# Patient Record
Sex: Female | Born: 1975 | Race: White | Hispanic: No | Marital: Single | Smoking: Never smoker
Health system: Southern US, Community
[De-identification: ages and names within clinical notes are randomized; demographics above are authoritative.]

## PROBLEM LIST (undated history)

## (undated) DIAGNOSIS — I499 Cardiac arrhythmia, unspecified: Secondary | ICD-10-CM

## (undated) DIAGNOSIS — R87619 Unspecified abnormal cytological findings in specimens from cervix uteri: Secondary | ICD-10-CM

## (undated) DIAGNOSIS — IMO0002 Reserved for concepts with insufficient information to code with codable children: Secondary | ICD-10-CM

## (undated) DIAGNOSIS — F419 Anxiety disorder, unspecified: Secondary | ICD-10-CM

## (undated) DIAGNOSIS — H9190 Unspecified hearing loss, unspecified ear: Secondary | ICD-10-CM

## (undated) HISTORY — PX: CLEFT LIP REPAIR: SUR1164

## (undated) HISTORY — DX: Unspecified abnormal cytological findings in specimens from cervix uteri: R87.619

## (undated) HISTORY — DX: Reserved for concepts with insufficient information to code with codable children: IMO0002

## (undated) HISTORY — PX: LEEP: SHX91

---

## 2000-02-10 ENCOUNTER — Encounter: Admission: RE | Admit: 2000-02-10 | Discharge: 2000-02-10 | Payer: Self-pay | Admitting: Family Medicine

## 2001-10-17 ENCOUNTER — Other Ambulatory Visit: Admission: RE | Admit: 2001-10-17 | Discharge: 2001-10-17 | Payer: Self-pay | Admitting: Family Medicine

## 2001-11-09 ENCOUNTER — Encounter: Admission: RE | Admit: 2001-11-09 | Discharge: 2001-11-09 | Payer: Self-pay | Admitting: Family Medicine

## 2002-08-21 ENCOUNTER — Encounter: Admission: RE | Admit: 2002-08-21 | Discharge: 2002-08-21 | Payer: Self-pay | Admitting: Family Medicine

## 2003-11-16 ENCOUNTER — Encounter: Admission: RE | Admit: 2003-11-16 | Discharge: 2003-11-16 | Payer: Self-pay | Admitting: Family Medicine

## 2003-12-20 ENCOUNTER — Encounter (INDEPENDENT_AMBULATORY_CARE_PROVIDER_SITE_OTHER): Payer: Self-pay | Admitting: Specialist

## 2003-12-20 ENCOUNTER — Encounter: Admission: RE | Admit: 2003-12-20 | Discharge: 2003-12-20 | Payer: Self-pay | Admitting: Obstetrics and Gynecology

## 2003-12-20 ENCOUNTER — Other Ambulatory Visit: Admission: RE | Admit: 2003-12-20 | Discharge: 2003-12-20 | Payer: Self-pay | Admitting: Family Medicine

## 2004-01-03 ENCOUNTER — Encounter: Admission: RE | Admit: 2004-01-03 | Discharge: 2004-01-03 | Payer: Self-pay | Admitting: Family Medicine

## 2004-01-10 ENCOUNTER — Encounter: Admission: RE | Admit: 2004-01-10 | Discharge: 2004-01-10 | Payer: Self-pay | Admitting: Family Medicine

## 2004-04-17 ENCOUNTER — Encounter (INDEPENDENT_AMBULATORY_CARE_PROVIDER_SITE_OTHER): Payer: Self-pay | Admitting: Specialist

## 2004-04-17 ENCOUNTER — Encounter: Admission: RE | Admit: 2004-04-17 | Discharge: 2004-04-17 | Payer: Self-pay | Admitting: Obstetrics and Gynecology

## 2004-12-05 ENCOUNTER — Ambulatory Visit: Payer: Self-pay | Admitting: Family Medicine

## 2004-12-05 ENCOUNTER — Encounter (INDEPENDENT_AMBULATORY_CARE_PROVIDER_SITE_OTHER): Payer: Self-pay | Admitting: *Deleted

## 2005-06-12 ENCOUNTER — Ambulatory Visit: Payer: Self-pay | Admitting: Family Medicine

## 2005-12-04 ENCOUNTER — Ambulatory Visit: Payer: Self-pay | Admitting: *Deleted

## 2005-12-04 ENCOUNTER — Encounter (INDEPENDENT_AMBULATORY_CARE_PROVIDER_SITE_OTHER): Payer: Self-pay | Admitting: *Deleted

## 2007-03-31 ENCOUNTER — Ambulatory Visit: Payer: Self-pay | Admitting: Family Medicine

## 2007-03-31 ENCOUNTER — Encounter: Payer: Self-pay | Admitting: Obstetrics and Gynecology

## 2011-02-10 NOTE — Group Therapy Note (Signed)
Adrienne Wood, Adrienne Wood           ACCOUNT NO.:  [id number]   MEDICAL RECORD NO.:  [id number]          PATIENT TYPE:  WOC   LOCATION:  [facility]                   FACILITY:  [facility]   PHYSICIAN:  Elsie Lincoln, MD      DATE OF BIRTH:  [birth year]   DATE OF SERVICE:  03/31/2007                                  CLINIC NOTE   DICTATION:  Dr. Sylvan Cheese dictating for Dr. Elsie Lincoln.   CHIEF COMPLAINT:  Annual exam with Pap smear.   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  Her  Pap smears in March of 2006, September of 2006, and March of 2007 have  all been within normal limits.  She denies any problems.  She has  stopped taking the Ortho-Evra patch for birth control due to expense and  is currently using condoms for birth control.  She has been sexually  active with one partner in the past year.   PHYSICAL EXAMINATION:  VITAL SIGNS:  Stable and within normal limits   Dictation ended at this point.     ______________________________  Elsie Lincoln, MD    ______________________________  Elsie Lincoln, MD    KL/MEDQ  D:  03/31/2007  T:  03/31/2007  Job:  [id number]

## 2011-02-10 NOTE — Group Therapy Note (Signed)
NAMECOLIE, JOSTEN           ACCOUNT NO.:  [id number]   MEDICAL RECORD NO.:  [id number]          PATIENT TYPE:  WOC   LOCATION:  [facility]                   FACILITY:  [facility]   PHYSICIAN:  Elsie Lincoln, MD      DATE OF BIRTH:  04-Aug-1976   DATE OF SERVICE:  03/31/2007                                  CLINIC NOTE   DICTATION:  Dr. Sylvan Cheese dictating for Dr. Elsie Lincoln.   CHIEF COMPLAINT:  Annual exam with Pap smear.   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  Her  Pap smears in March of 2006, September of 2006, and March of 2007, were  all within normal limits.  She is currently not having any gynecological  problems.  She has stopped the Ortho-Evra patch due to high expense and  is currently using condoms for birth control.  She has been sexually  active with one partner in the past year.   PHYSICAL EXAMINATION:  VITAL SIGNS:  Stable and within normal limits.  GENERAL:  The patient is alert and cooperative with the examination and  appears in no distress.  GENITOURINARY:  Normal external female genitalia, the vagina is moist  with pink rugae.  The cervix has an enlarged squamocolumnar junction.  A  Pap smear was performed today.   IMPRESSION:  History of cervical intraepithelial neoplasia 3, status  post loop electrosurgical excision procedure and with three previous  normal Pap smears.   PLAN:  A Pap smear was performed today.  We will continue yearly Pap  smear if this one is normal.  The patient will return in one year for  Pap smear.     ______________________________  Elsie Lincoln, MD    ______________________________  Elsie Lincoln, MD    KL/MEDQ  D:  03/31/2007  T:  04/01/2007  Job:  [id number]

## 2011-02-13 NOTE — Group Therapy Note (Signed)
NAMEALYNAH, SCHONE           ACCOUNT NO.:  [id number]   MEDICAL RECORD NO.:  [id number]          PATIENT TYPE:  WOC   LOCATION[facility]  [facility]                   FACILITY[facility]  [facility]   PHYSICIAN:  Tinnie Gens, MD        DATE OF BIRTH:  17-Nov-1975   DATE OF SERVICE:                                    CLINIC NOTE   CHIEF COMPLAINT:  Repeat Pap.   HISTORY OF PRESENT ILLNESS:  The patient is a 35 year old gravida 1, para 0,  who is status post LEEP in March 2005 for CIN3.  She needs repeat Pap today,  her last Pap was in July 2005, she had slight squamous atypia with a  negative high risk HPV type.  Today she has done well, she continues on  Ortho Novum and is without complaints.   PHYSICAL EXAMINATION:  Her vitals are as noted on the chart.  She is a well-  developed, well-nourished white female in no acute distress.  GU:  She has  normal external female genitalia, the cervix is visualized, scarring from  old LEEP is noted, but no other lesion.  On exam of her uterus, it is  anteverted, adnexa were without mass or tenderness.   IMPRESSION:  Status post loop electrosurgical excision procedure November 29, 2003.   PLAN:  Pap smear today, followup Pap in six months.      TP/MEDQ  D:  12/05/2004  T:  12/06/2004  Job:  [id number]

## 2011-02-13 NOTE — Group Therapy Note (Signed)
NAME:  Adrienne Wood, Adrienne Wood                     ACCOUNT NO.:  [id number]   MEDICAL RECORD NO.:  [id number]                   PATIENT TYPE:  OUT   LOCATION:  [facility]                           [facility]ACILITY[facility]   PHYSICIAN:  Tinnie Gens, MD                     DATE OF BIRTH:  1976/03/05   DATE OF SERVICE:  12/20/2003                                    CLINIC NOTE   CHIEF COMPLAINT:  Abnormal Pap.   HISTORY OF PRESENT ILLNESS:  The patient is a 35 year old gravida 1 para 0  who has CIN-3 on biopsy with extension to the endocervical gland who comes  for a LEEP treatment.   PROCEDURE:  The patient was placed in the dorsal lithotomy.  Acetic acid was  applied to the cervix.  The largest acetowhite area was noted from 12 to 3  o'clock.  The cervix was subsequently numbed with 10 mL of lidocaine and  then a LEEP cone taken of the anterior and posterior portion of the cervix  as well as the endocervical gland section followed by cauterization with the  ball and an application of Monsel solution.  Hemostasis was excellent. The  patient tolerated the procedure well.  She was taken out of lithotomy.   IMPRESSION:  Loop electrosurgical excision procedure for cervical  intraepithelial neoplasia grade 3.   PLAN:  Will follow her up in 2 weeks, look at her cervix, and review  pathology.                                               Tinnie Gens, MD    TP/MEDQ  D:  12/20/2003  T:  12/20/2003  Job:  [id number]

## 2011-02-13 NOTE — Group Therapy Note (Signed)
NAME:  Adrienne Wood, Adrienne Wood                     ACCOUNT NO.:  [id number]   MEDICAL RECORD NO.:  [id number]                   PATIENT TYPE:  OUT   LOCATION[facility]                           [facility]   PHYSICIAN:  Tinnie Gens, MD                     DATE OF BIRTH:  1975-12-19   DATE OF SERVICE:  04/17/2004                                    CLINIC NOTE   CHIEF COMPLAINT:  Abnormal Pap.   HISTORY OF PRESENT ILLNESS:  The patient is a 35 year old gravida 1 para 0  who is status post LEEP in March 2005.  She needs a repeat Pap today.  She  reports that she is on the Ortho Evra patch and would like to continue this  but needs refills.   PHYSICAL EXAMINATION TODAY:  VITAL SIGNS:  As noted in the chart.  GENERAL:  A well-developed, well-nourished white female in no acute  distress.  GENITOURINARY:  Normal external female genitalia.  The cervix is visualized.  Scarring from the old LEEP is noted but there is no other lesion.  Pap smear  is obtained.  On exam her uterus is anteverted.  No adnexal mass or  tenderness.   IMPRESSION:  1. Cervical intraepithelial neoplasia grade 3 status post loop     electrocautery excision procedure.  2. Contraceptive counseling.   PLAN:  1. Pap smear today.  2. Ortho Evra patch refilled.                                               Tinnie Gens, MD    TP/MEDQ  D:  04/17/2004  T:  04/17/2004  Job:  [id number]

## 2011-02-13 NOTE — Group Therapy Note (Signed)
NAME:  Adrienne Wood, Adrienne Wood NO.:  [id number]   MEDICAL RECORD NO.:  [id number]                   PATIENT TYPE:  OUT   LOCATION[facility]                           [facility]ILITY[facility]   PHYSICIAN:  Tinnie Gens, MD                     DATE OF BIRTH:  March 06, 1976   DATE OF SERVICE:  11/16/2003                                    CLINIC NOTE   CHIEF COMPLAINT:  The patient is a 35 year old G1 P0 who has been referred  here from [facility] with high-grade SIL on Pap followed by CIN 3 with  involvement of endocervical glands but a negative ECC on colposcopy.  The  patient has lost her insurance and desires to have follow-up treatment for  this.   PAST MEDICAL HISTORY:  Negative.   PAST SURGICAL HISTORY:  She had an ear operation and repair of cleft lip in  1979.   OBSTETRICAL HISTORY:  She is a G1 P0.  She had an SAB approximately 6 months  ago with Cytotec used for its conclusion.   GYNECOLOGICAL HISTORY:  Menarche is age 29.  LMP November 13, 2003.  Cycles  come every 28 days, last 7 days.  She is currently using condoms for birth  control.  She does have a history of abnormal Pap in 2001, 2002, 2004 with  colposcopy that was benign results at those times.   FAMILY HISTORY:  Significant for coronary artery disease in a dad and  grandmother and a stroke in her grandmother.   SOCIAL HISTORY:  She denies tobacco, alcohol, or drug use.   REVIEW OF SYMPTOMS:  A 14-point review of systems is reviewed and negative.   PHYSICAL EXAMINATION:  VITAL SIGNS:  Her pulse is 96, blood pressure 132/81,  weight 105.  GENERAL:  She is thin white female in no acute distress.  LUNGS:  Clear bilaterally.  CARDIOVASCULAR:  Regular rate and rhythm.  No rubs, gallops, murmurs.   I have advised the patient of all the options and treatment recommendations.  She will proceed with LEEP.  She will watch the video today.  I will also  give her Valium 5 mg to take 30 minutes prior to  the procedure to help with  her nerves.                                               Tinnie Gens, MD    TP/MEDQ  D:  11/16/2003  T:  11/16/2003  Job:  [id number]

## 2011-02-13 NOTE — Group Therapy Note (Signed)
Adrienne Wood, Adrienne Wood           ACCOUNT NO.:  [id number]   MEDICAL RECORD NO.:  [id number]          PATIENT TYPE:  WOC   [facility]ATION[facility]                   [facility]   PHYSICIAN:  Tinnie Gens, MD        DATE OF BIRTH:  1975-12-27   DATE OF SERVICE:  06/12/2005                                    CLINIC NOTE   CHIEF COMPLAINT:  Pap smear.   HISTORY OF PRESENT ILLNESS:  Patient is a 35 year old gravida 1, para 0 who  is status post LEEP in March of 2005 for CIN III.  Her last Pap was in March  of 2006 and was normal and she is for her six month follow-up.  Patient is  without complaints today.   PHYSICAL EXAMINATION:  VITAL SIGNS:  As noted in the chart.  GENERAL:  She is a well-developed, well-nourished white female in no acute  distress.  GENITOURINARY:  Normal external female genitalia.  Vagina is pink and  rugated.  The cervix is visualized and has an enlarged squamocolumnar  junction probably related to her previous LEEP.  The uterus is small,  anteverted.  Adnexa are without mass or tenderness.   IMPRESSION:  History of CIN III status post LEEP.  Last Pap smear was normal  in March for six-month follow-up today.   PLAN:  Pap smear today.  If this is normal will follow up again in six  months and then go to yearly Paps.           ______________________________  Tinnie Gens, MD     TP/MEDQ  D:  06/12/2005  T:  06/12/2005  Job:  [id number]

## 2011-02-13 NOTE — Group Therapy Note (Signed)
Adrienne Wood, Adrienne Wood           ACCOUNT NO.:  [id number]   MEDICAL RECORD NO.:  [id number]          PATIENT TYPE:  WOC   [facility]ATION[facility]                   [facility]   PHYSICIAN:  Carolanne Grumbling, M.D.   DATE OF BIRTH:  02/27/1976   DATE OF SERVICE:  12/04/2005                                    CLINIC NOTE   A 35 year old here for repeat Pap smear.  The patient is status post a LEEP  in March of 2005 for CIN-3.  Pap smear in March 2006 and September of 2006  were both within normal limits.  The patient denies any problems.  She is  currently on Ortho Evra for birth control.   PHYSICAL EXAMINATION:  VITAL SIGNS:  Per the chart.  GENERAL:  Well-developed, well-nourished female in no apparent distress.  GENITOURINARY:  Normal external female genitalia.  The vagina has pink  rugae.  The cervix does have an enlarged squamocolumnar junction.  A Pap  smear was performed.   IMPRESSION:  History of cervical intraepithelial neoplasia 3 status post  loop electrosurgical excision procedure and 2 previous Pap smears were  within normal limits.   PLAN:  A Pap smear today.  If normal, we will start doing yearly Pap smears.           ______________________________  Carolanne Grumbling, M.D.     TW/MEDQ  D:  12/04/2005  T:  12/05/2005  Job:  [id number]

## 2011-12-02 ENCOUNTER — Encounter: Payer: Self-pay | Admitting: Obstetrics and Gynecology

## 2011-12-08 ENCOUNTER — Encounter: Payer: Self-pay | Admitting: Family Medicine

## 2011-12-14 ENCOUNTER — Encounter: Payer: Self-pay | Admitting: Obstetrics & Gynecology

## 2011-12-21 ENCOUNTER — Other Ambulatory Visit (HOSPITAL_COMMUNITY)
Admission: RE | Admit: 2011-12-21 | Discharge: 2011-12-21 | Disposition: A | Discharge status: Home / Self Care | Payer: Self-pay | Source: Ambulatory Visit | Attending: Family | Admitting: Family

## 2011-12-21 ENCOUNTER — Encounter: Payer: Self-pay | Admitting: Physician Assistant

## 2011-12-21 ENCOUNTER — Ambulatory Visit (INDEPENDENT_AMBULATORY_CARE_PROVIDER_SITE_OTHER): Payer: Self-pay | Admitting: Physician Assistant

## 2011-12-21 VITALS — BP 134/86 | HR 89 | Temp 98.3°F | Ht 61.0 in | Wt 118.0 lb

## 2011-12-21 DIAGNOSIS — R87619 Unspecified abnormal cytological findings in specimens from cervix uteri: Secondary | ICD-10-CM | POA: Insufficient documentation

## 2011-12-21 DIAGNOSIS — Z01812 Encounter for preprocedural laboratory examination: Secondary | ICD-10-CM

## 2011-12-21 DIAGNOSIS — IMO0002 Reserved for concepts with insufficient information to code with codable children: Secondary | ICD-10-CM

## 2011-12-21 DIAGNOSIS — R87612 Low grade squamous intraepithelial lesion on cytologic smear of cervix (LGSIL): Secondary | ICD-10-CM

## 2011-12-21 LAB — POCT PREGNANCY, URINE: Preg Test, Ur: NEGATIVE

## 2011-12-21 NOTE — Patient Instructions (Signed)
Colposcopy Care After Colposcopy is a procedure in which a special tool is used to magnify the surface of the cervix. A tissue sample (biopsy) may also be taken. This sample will be looked at for cervical cancer or other problems. After the test:  You may have some cramping.   Lie down for a few minutes if you feel lightheaded.    You may have some bleeding which should stop in a few days.  HOME CARE  Do not have sex or use tampons for 2 to 3 days or as told.   Only take medicine as told by your doctor.   Continue to take your birth control pills as usual.  Finding out the results of your test Ask when your test results will be ready. Make sure you get your test results. GET HELP RIGHT AWAY IF:  You are bleeding a lot or are passing blood clots.   You develop a fever of 102 F (38.9 C) or higher.   You have abnormal vaginal discharge.   You have cramps that do not go away with medicine.   You feel lightheaded, dizzy, or pass out (faint).  MAKE SURE YOU:   Understand these instructions.   Will watch your condition.   Will get help right away if you are not doing well or get worse.  Document Released: 03/02/2008 Document Revised: 09/03/2011 Document Reviewed: 03/02/2008 [employer] Patient Information 2012 [employer]. 

## 2011-12-21 NOTE — Progress Notes (Signed)
Chief Complaint:  Colposcopy   Adrienne Wood is  36 y.o. G1P0010.  Patient's last menstrual period was 11/07/2011.Marland Kitchen  Her pregnancy status is negative.  She presents complaining of Colposcopy  Presents for colposcopy secondary to LSIL pap in 10/2011 in [place]. Hx of LEEP for CIN2/3 in 2005.   Obstetrical/Gynecological History: OB History    Grav Para Term Preterm Abortions TAB SAB Ect Mult Living   1 0 0 0 1  1   0      Past Medical History: Past Medical History  Diagnosis Date  . Abnormal Pap smear     Past Surgical History: Past Surgical History  Procedure Date  . Cleft lip repair   . Leep     Family History: History reviewed. No pertinent family history.  Social History: History  Substance Use Topics  . Smoking status: Never Smoker   . Smokeless tobacco: Never Used  . Alcohol Use: Yes    Allergies: No Known Allergies  Review of Systems - Negative except what has been reviewed in HPI  Physical Exam   Blood pressure 134/86, pulse 89, temperature 98.3 F (36.8 C), temperature source Oral, height 5\' 1"  (1.549 m), weight 118 lb (53.524 kg), last menstrual period 11/07/2011.  General: General appearance - alert, well appearing, and in no distress, oriented to person, place, and time and normal appearing weight Mental status - alert, oriented to person, place, and time, normal mood, behavior, speech, dress, motor activity, and thought processes, affect appropriate to mood Focused Gynecological Exam: Patient given informed consent, signed copy in the chart, time out was performed.  Placed in lithotomy position. Cervix viewed with speculum and colposcope after application of acetic acid.   Colposcopy adequate?  yes Acetowhite lesions?yes Punctation?yes Mosaicism?  no Abnormal vasculature?  no Biopsies?6 o'clock, 8 o'clock, 11 o'clock ECC?yes  COMMENTS:6 and 8 o'clock favor HPV and CIN 1, 11 o'clock possibly higher grade (punctation)   Labs: Recent  Results (from the past 24 hour(s))  POCT PREGNANCY, URINE   Collection Time   12/21/11  1:27 PM      Component Value Range   Preg Test, Ur NEGATIVE  NEGATIVE    Imaging Studies:  No results found.   Assessment: 1. LSIL (low grade squamous intraepithelial lesion) on Pap smear  Surgical pathology   Plan: RTC in 2-3 weeks for results Recommend daily folic acid supplementation  Adrienne Wood E. 12/21/2011,1:47 PM

## 2011-12-21 NOTE — Progress Notes (Signed)
Addended by: Sherre Lain A on: 12/21/2011 04:33 PM   Modules accepted: Orders

## 2012-01-06 ENCOUNTER — Telehealth: Payer: Self-pay | Admitting: *Deleted

## 2012-01-06 NOTE — Telephone Encounter (Signed)
Pt called requesting her colpo results. I called her back and let her know that I would check with Adrienne Wood on the results and what follow up she needs and we would call her back with either the results or to let her know to keep her upcoming appt.

## 2012-01-11 ENCOUNTER — Ambulatory Visit: Payer: Self-pay | Admitting: Advanced Practice Midwife

## 2012-01-13 NOTE — Telephone Encounter (Signed)
CIN I. Low grade abnormal.Needs repap and HPV testing in 12 months

## 2012-01-13 NOTE — Telephone Encounter (Signed)
Spoke with patient and advised her of results. Pt voiced understanding and will cancel her appt for next week. She inquired about getting her depo shot here in the future. I told her that she didn't have to come here for the shot, she could continue going to the provider that was giving it to her originally. She stated that if she needed to come here she would call for appt.

## 2012-01-28 ENCOUNTER — Ambulatory Visit: Payer: Self-pay | Admitting: Advanced Practice Midwife

## 2012-08-22 ENCOUNTER — Ambulatory Visit: Payer: Self-pay | Admitting: Obstetrics & Gynecology

## 2012-09-19 ENCOUNTER — Ambulatory Visit: Payer: Self-pay | Admitting: Obstetrics & Gynecology

## 2013-04-20 ENCOUNTER — Ambulatory Visit (INDEPENDENT_AMBULATORY_CARE_PROVIDER_SITE_OTHER): Payer: BC Managed Care – PPO | Admitting: Family Medicine

## 2013-04-20 ENCOUNTER — Encounter: Payer: Self-pay | Admitting: Family Medicine

## 2013-04-20 VITALS — BP 126/84 | HR 87 | Temp 98.0°F | Ht 61.0 in | Wt 130.2 lb

## 2013-04-20 DIAGNOSIS — N870 Mild cervical dysplasia: Secondary | ICD-10-CM

## 2013-04-20 NOTE — Patient Instructions (Addendum)
HPV Test The HPV (human papillomavirus) test is used to screen for high-risk types with HPV infection. HPV is a group of about 100 related viruses, of which 40 types are genital viruses. Most HPV viruses cause infections that usually resolve without treatment within 2 years. Some HPV infections can cause skin and genital warts (condylomata). HPV types 16, 18, 31 and 45 are considered high-risk types of HPV. High-risk types of HPV do not usually cause visible warts, but if untreated, may lead to cancers of the outlet of the womb (cervix) or anus. An HPV test identifies the DNA (genetic) strands of the HPV infection. Because the test identifies the DNA strands, the test is also referred to as the HPV DNA test. Although HPV is found in both males and females, the HPV test is only used to screen for cervical cancer in females. This test is recommended for females:  With an abnormal Pap test.  After treatment of an abnormal Pap test.  Aged 20 and older.  After treatment of a high-risk HPV infection. The HPV test may be done at the same time as a Pap test in females over the age of 20. Both the HPV and Pap test require a sample of cells from the cervix. PREPARATION FOR TEST  You may be asked to avoid douching, tampons, or vaginal medicines for 48 hours before the HPV test. You will be asked to urinate before the test. For the HPV test, you will need to lie on an exam table with your feet in stirrups. A spatula will be inserted into the vagina. The spatula will be used to swab the cervix for a cell and mucus sample. The sample will be further evaluated in a lab under a microscope. NORMAL FINDINGS  Normal: High-risk HPV is not found.  Ranges for normal findings may vary among different laboratories and hospitals. You should always check with your doctor after having lab work or other tests done to discuss the meaning of your test results and whether your values are considered within normal  limits. MEANING OF TEST An abnormal HPV test means that high-risk HPV is found. Your caregiver may recommend further testing. Your caregiver will go over the test results with you. He or she will and discuss the importance and meaning of your results, as well as treatment options and the need for additional tests, if necessary. OBTAINING THE RESULTS  It is your responsibility to obtain your test results. Ask the lab or department performing the test when and how you will get your results. Document Released: 10/09/2004 Document Revised: 12/07/2011 Document Reviewed: 06/24/2005 [facility] Patient Information 2014 [place], [place].  Pap Test A Pap test is a procedure done in a clinic office to evaluate cells that are on the surface of the cervix. The cervix is the lower portion of the uterus and upper portion of the vagina. For some women, the cervical region has the potential to form cancer. With consistent evaluations by your caregiver, this type of cancer can be prevented.  If a Pap test is abnormal, it is most often a result of a previous exposure to human papillomavirus (HPV). HPV is a virus that can infect the cells of the cervix and cause dysplasia. Dysplasia is where the cells no longer look normal. If a woman has been diagnosed with high-grade or severe dysplasia, they are at higher risk of developing cervical cancer. People diagnosed with low-grade dysplasia should still be seen by their caregiver because there is a small  chance that low-grade dysplasia could develop into cancer.  LET YOUR CAREGIVER KNOW ABOUT:  Recent sexually transmitted infection (STI) you have had.  Any new sex partners you have had.  History of previous abnormal Pap tests results.  History of previous cervical procedures you have had (colposcopy, biopsy, loop electrosurgical excision procedure [LEEP]).  Concerns you have had regarding unusual vaginal discharge.  History of pelvic pain.  Your use of birth  control. BEFORE THE PROCEDURE  Ask your caregiver when to schedule your Pap test. It is best not to be on your period if your caregiver uses a wooden spatula to collect cells or applies cells to a glass slide. Newer techniques are not so sensitive to the timing of a menstrual cycle.  Do not douche or have sexual intercourse for 24 hours before the test.   Do not use vaginal creams or tampons for 24 hours before the test.   Empty your bladder just before the test to lessen any discomfort.  PROCEDURE You will lie on an exam table with your feet in stirrups. A warm metal or plastic instrument (speculum) is placed in your vagina. This instrument allows your caregiver to see the inside of your vagina and look at your cervix. A small, plastic brush or wooden spatula is then used to collect cervical cells. These cells are placed in a lab specimen container. The cells are looked at under a microscope. A specialist will determine if the cells are normal.  AFTER THE PROCEDURE Make sure to get your test results.If your results come back abnormal, you may need further testing.  Document Released: 12/05/2002 Document Revised: 12/07/2011 Document Reviewed: 09/10/2011 [facility] Patient Information 2014 [place], [place].

## 2013-04-20 NOTE — Progress Notes (Signed)
Patient ID: Adrienne Wood, female   DOB: 1976-01-30, 37 y.o.   MRN: [id number]  S: 37 yo G1P0010 coming in for a pap smear.  Had colpo 11/2011 which showed CIN1 and told to do repap in one year.  History of LEEP in 2005 from CIN3 by colpo.  Does not recall having colpo back in 11/2011 but documented in EMR and surgical pathology report.   Had two subsequent pap smears that were normal, following pap smear [facility] on [facility] .  Has had one sexual partner since last visit but not interested in STI testing today.  Denies vaginal discharge, bleeding, pelvic pain.  Past Medical History  Diagnosis Date  . Abnormal Pap smear    No Known Allergies History reviewed. No pertinent family history. History   Social History  . Marital Status: Single    Spouse Name: N/A    Number of Children: N/A  . Years of Education: N/A   Occupational History  . Not on file.   Social History Main Topics  . Smoking status: Never Smoker   . Smokeless tobacco: Never Used  . Alcohol Use: Yes     Comment: occasional  . Drug Use: No  . Sexually Active: No   Other Topics Concern  . Not on file   Social History Narrative  . No narrative on file    O: BP 126/84  Pulse 87  Temp(Src) 98 F (36.7 C)  Ht 5\' 1"  (1.549 m)  Wt 59.058 kg (130 lb 3.2 oz)  BMI 24.61 kg/m2  LMP 04/01/2013 General:  Alert, cooperative, under no distress Cardio:  Regular rate and rhythm, no murmurs or gallops Chest:  No respiratory distress, lungs clear bilaterally to auscultation Breast:  No masses or tenderness to palpation, no axillary lymphadenopathy Pelvic:  Mild discharge, no gross lesions seen on speculum exam, no adnexal or cervical motion tenderness    A: 1 year (18 months) follow from Colpo from CIN-1 (11/2011) preceded by LSIL pap.   Hx of CIN-3 with LEEP in 2005  P:  Proceed as per ASCCP guidelines once results are obtained.     I saw and examined patient and agree with above student  note. Pap with HPV co-test ordered per guidelines. Discussed importance of HPV and relation to cervical cancer.  Napoleon Form, MD

## 2013-04-27 ENCOUNTER — Encounter: Payer: Self-pay | Admitting: Family Medicine

## 2013-04-27 DIAGNOSIS — IMO0002 Reserved for concepts with insufficient information to code with codable children: Secondary | ICD-10-CM | POA: Insufficient documentation

## 2013-05-01 ENCOUNTER — Telehealth: Payer: Self-pay | Admitting: Obstetrics and Gynecology

## 2013-05-01 NOTE — Telephone Encounter (Addendum)
Message copied by Toula Moos on Mon May 01, 2013  1:52 PM   ------called patient and gave result of pap and need pap in one year as stated below. Patient agrees and satisfied.       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 She will need repeat cotesting - pap with HPV in one year. Please let her know that she DOES need follow up in one year. ------

## 2014-04-25 ENCOUNTER — Ambulatory Visit (INDEPENDENT_AMBULATORY_CARE_PROVIDER_SITE_OTHER): Payer: BC Managed Care – PPO | Admitting: Advanced Practice Midwife

## 2014-04-25 ENCOUNTER — Encounter: Payer: Self-pay | Admitting: Advanced Practice Midwife

## 2014-04-25 VITALS — BP 127/75 | HR 74 | Temp 98.3°F | Resp 20 | Ht 61.0 in | Wt 130.8 lb

## 2014-04-25 DIAGNOSIS — N760 Acute vaginitis: Secondary | ICD-10-CM

## 2014-04-25 DIAGNOSIS — A499 Bacterial infection, unspecified: Secondary | ICD-10-CM

## 2014-04-25 DIAGNOSIS — N898 Other specified noninflammatory disorders of vagina: Secondary | ICD-10-CM

## 2014-04-25 DIAGNOSIS — N9489 Other specified conditions associated with female genital organs and menstrual cycle: Secondary | ICD-10-CM

## 2014-04-25 DIAGNOSIS — Z124 Encounter for screening for malignant neoplasm of cervix: Secondary | ICD-10-CM

## 2014-04-25 DIAGNOSIS — B9689 Other specified bacterial agents as the cause of diseases classified elsewhere: Secondary | ICD-10-CM

## 2014-04-25 MED ORDER — METRONIDAZOLE 500 MG PO TABS: 500.0000 mg | ORAL_TABLET | Freq: Two times a day (BID) | ORAL | Status: DC

## 2014-04-25 NOTE — Progress Notes (Signed)
Pt here for routine Gyn exam w/Pap & HPV co-testing

## 2014-04-25 NOTE — Progress Notes (Signed)
  Subjective:     Adrienne Wood is a 38 y.o. woman who comes in today for a well-woman exam and pap.  She has hx of abnormal pap and LEEP in 2005 with normal pap results following procedure until 2013.  F/U Pap in 2014 with normal cytology but positive HPV.  She reports an odor with discharge occasionally, worse near her menses.  She originally wanted to start Depo Provera at today's visit but is no longer sexually active at this time so does not want hormonal contraception now.    The following portions of the patient's history were reviewed and updated as appropriate: allergies, current medications, past family history, past medical history, past social history, past surgical history and problem list.  Review of Systems A comprehensive review of systems was negative except for: vaginal discharge with odor occurring before/after menses each month.   Objective:    BP 127/75  Pulse 74  Temp(Src) 98.3 F (36.8 C) (Oral)  Resp 20  Ht 5\' 1"  (1.549 m)  Wt 130 lb 12.8 oz (59.33 kg)  BMI 24.73 kg/m2  LMP 04/08/2014 Physical Examination: General appearance - alert, well appearing, and in no distress, oriented to person, place, and time and acyanotic, in no respiratory distress Neck - supple, no significant adenopathy, thyroid exam: thyroid is normal in size without nodules or tenderness Chest - clear to auscultation, no wheezes, rales or rhonchi, symmetric air entry Heart - normal rate, regular rhythm, normal S1, S2, no murmurs, rubs, clicks or gallops Breasts - breasts appear normal, no suspicious masses, no skin or nipple changes or axillary nodes Pelvic Exam: cervix normal in appearance, external genitalia normal and vagina normal without discharge. Pap smear obtained.   Assessment:    Screening pap smear with cotesting.   Plan:    Follow up in 1 year, or as indicated by Pap results Flagyl BID x 7 days.

## 2014-04-27 LAB — CYTOLOGY - PAP

## 2014-05-09 ENCOUNTER — Telehealth: Payer: Self-pay | Admitting: *Deleted

## 2014-05-09 NOTE — Telephone Encounter (Signed)
Pt requests Pap results from visit on 04/25/14.

## 2014-05-10 NOTE — Telephone Encounter (Signed)
Called Peytan back, gave her results of papsmear- all negative.

## 2014-07-30 ENCOUNTER — Encounter: Payer: Self-pay | Admitting: Advanced Practice Midwife

## 2016-02-17 ENCOUNTER — Ambulatory Visit (INDEPENDENT_AMBULATORY_CARE_PROVIDER_SITE_OTHER): Payer: Self-pay | Admitting: Obstetrics & Gynecology

## 2016-02-17 ENCOUNTER — Encounter: Payer: Self-pay | Admitting: Obstetrics & Gynecology

## 2016-02-17 VITALS — BP 129/84 | HR 72 | Temp 98.0°F | Resp 18 | Ht 61.0 in | Wt 139.8 lb

## 2016-02-17 DIAGNOSIS — Z01419 Encounter for gynecological examination (general) (routine) without abnormal findings: Secondary | ICD-10-CM

## 2016-02-17 DIAGNOSIS — Z124 Encounter for screening for malignant neoplasm of cervix: Secondary | ICD-10-CM

## 2016-02-17 DIAGNOSIS — Z1151 Encounter for screening for human papillomavirus (HPV): Secondary | ICD-10-CM

## 2016-02-17 NOTE — Patient Instructions (Signed)
Preventive Care for Adults, Female A healthy lifestyle and preventive care can promote health and wellness. Preventive health guidelines for women include the following key practices.  A routine yearly physical is a good way to check with your health care provider about your health and preventive screening. It is a chance to share any concerns and updates on your health and to receive a thorough exam.  Visit your dentist for a routine exam and preventive care every 6 months. Brush your teeth twice a day and floss once a day. Good oral hygiene prevents tooth decay and gum disease.  The frequency of eye exams is based on your age, health, family medical history, use of contact lenses, and other factors. Follow your health care provider's recommendations for frequency of eye exams.  Eat a healthy diet. Foods like vegetables, fruits, whole grains, low-fat dairy products, and lean protein foods contain the nutrients you need without too many calories. Decrease your intake of foods high in solid fats, added sugars, and salt. Eat the right amount of calories for you.Get information about a proper diet from your health care provider, if necessary.  Regular physical exercise is one of the most important things you can do for your health. Most adults should get at least 150 minutes of moderate-intensity exercise (any activity that increases your heart rate and causes you to sweat) each week. In addition, most adults need muscle-strengthening exercises on 2 or more days a week.  Maintain a healthy weight. The body mass index (BMI) is a screening tool to identify possible weight problems. It provides an estimate of body fat based on height and weight. Your health care provider can find your BMI and can help you achieve or maintain a healthy weight.For adults 20 years and older:  A BMI below 18.5 is considered underweight.  A BMI of 18.5 to 24.9 is normal.  A BMI of 25 to 29.9 is considered overweight.  A  BMI of 30 and above is considered obese.  Maintain normal blood lipids and cholesterol levels by exercising and minimizing your intake of saturated fat. Eat a balanced diet with plenty of fruit and vegetables. Blood tests for lipids and cholesterol should begin at age 45 and be repeated every 5 years. If your lipid or cholesterol levels are high, you are over 50, or you are at high risk for heart disease, you may need your cholesterol levels checked more frequently.Ongoing high lipid and cholesterol levels should be treated with medicines if diet and exercise are not working.  If you smoke, find out from your health care provider how to quit. If you do not use tobacco, do not start.  Lung cancer screening is recommended for adults aged 45-80 years who are at high risk for developing lung cancer because of a history of smoking. A yearly low-dose CT scan of the lungs is recommended for people who have at least a 30-pack-year history of smoking and are a current smoker or have quit within the past 15 years. A pack year of smoking is smoking an average of 1 pack of cigarettes a day for 1 year (for example: 1 pack a day for 30 years or 2 packs a day for 15 years). Yearly screening should continue until the smoker has stopped smoking for at least 15 years. Yearly screening should be stopped for people who develop a health problem that would prevent them from having lung cancer treatment.  If you are pregnant, do not drink alcohol. If you are  breastfeeding, be very cautious about drinking alcohol. If you are not pregnant and choose to drink alcohol, do not have more than 1 drink per day. One drink is considered to be 12 ounces (355 mL) of beer, 5 ounces (148 mL) of wine, or 1.5 ounces (44 mL) of liquor.  Avoid use of street drugs. Do not share needles with anyone. Ask for help if you need support or instructions about stopping the use of drugs.  High blood pressure causes heart disease and increases the risk  of stroke. Your blood pressure should be checked at least every 1 to 2 years. Ongoing high blood pressure should be treated with medicines if weight loss and exercise do not work.  If you are 55-79 years old, ask your health care provider if you should take aspirin to prevent strokes.  Diabetes screening is done by taking a blood sample to check your blood glucose level after you have not eaten for a certain period of time (fasting). If you are not overweight and you do not have risk factors for diabetes, you should be screened once every 3 years starting at age 45. If you are overweight or obese and you are 40-70 years of age, you should be screened for diabetes every year as part of your cardiovascular risk assessment.  Breast cancer screening is essential preventive care for women. You should practice "breast self-awareness." This means understanding the normal appearance and feel of your breasts and may include breast self-examination. Any changes detected, no matter how small, should be reported to a health care provider. Women in their 20s and 30s should have a clinical breast exam (CBE) by a health care provider as part of a regular health exam every 1 to 3 years. After age 40, women should have a CBE every year. Starting at age 40, women should consider having a mammogram (breast X-ray test) every year. Women who have a family history of breast cancer should talk to their health care provider about genetic screening. Women at a high risk of breast cancer should talk to their health care providers about having an MRI and a mammogram every year.  Breast cancer gene (BRCA)-related cancer risk assessment is recommended for women who have family members with BRCA-related cancers. BRCA-related cancers include breast, ovarian, tubal, and peritoneal cancers. Having family members with these cancers may be associated with an increased risk for harmful changes (mutations) in the breast cancer genes BRCA1 and  BRCA2. Results of the assessment will determine the need for genetic counseling and BRCA1 and BRCA2 testing.  Your health care provider may recommend that you be screened regularly for cancer of the pelvic organs (ovaries, uterus, and vagina). This screening involves a pelvic examination, including checking for microscopic changes to the surface of your cervix (Pap test). You may be encouraged to have this screening done every 3 years, beginning at age 21.  For women ages 30-65, health care providers may recommend pelvic exams and Pap testing every 3 years, or they may recommend the Pap and pelvic exam, combined with testing for human papilloma virus (HPV), every 5 years. Some types of HPV increase your risk of cervical cancer. Testing for HPV may also be done on women of any age with unclear Pap test results.  Other health care providers may not recommend any screening for nonpregnant women who are considered low risk for pelvic cancer and who do not have symptoms. Ask your health care provider if a screening pelvic exam is right for   you.  If you have had past treatment for cervical cancer or a condition that could lead to cancer, you need Pap tests and screening for cancer for at least 20 years after your treatment. If Pap tests have been discontinued, your risk factors (such as having a new sexual partner) need to be reassessed to determine if screening should resume. Some women have medical problems that increase the chance of getting cervical cancer. In these cases, your health care provider may recommend more frequent screening and Pap tests.  Colorectal cancer can be detected and often prevented. Most routine colorectal cancer screening begins at the age of 50 years and continues through age 75 years. However, your health care provider may recommend screening at an earlier age if you have risk factors for colon cancer. On a yearly basis, your health care provider may provide home test kits to check  for hidden blood in the stool. Use of a small camera at the end of a tube, to directly examine the colon (sigmoidoscopy or colonoscopy), can detect the earliest forms of colorectal cancer. Talk to your health care provider about this at age 50, when routine screening begins. Direct exam of the colon should be repeated every 5-10 years through age 75 years, unless early forms of precancerous polyps or small growths are found.  People who are at an increased risk for hepatitis B should be screened for this virus. You are considered at high risk for hepatitis B if:  You were born in a country where hepatitis B occurs often. Talk with your health care provider about which countries are considered high risk.  Your parents were born in a high-risk country and you have not received a shot to protect against hepatitis B (hepatitis B vaccine).  You have HIV or AIDS.  You use needles to inject street drugs.  You live with, or have sex with, someone who has hepatitis B.  You get hemodialysis treatment.  You take certain medicines for conditions like cancer, organ transplantation, and autoimmune conditions.  Hepatitis C blood testing is recommended for all people born from 1945 through 1965 and any individual with known risks for hepatitis C.  Practice safe sex. Use condoms and avoid high-risk sexual practices to reduce the spread of sexually transmitted infections (STIs). STIs include gonorrhea, chlamydia, syphilis, trichomonas, herpes, HPV, and human immunodeficiency virus (HIV). Herpes, HIV, and HPV are viral illnesses that have no cure. They can result in disability, cancer, and death.  You should be screened for sexually transmitted illnesses (STIs) including gonorrhea and chlamydia if:  You are sexually active and are younger than 24 years.  You are older than 24 years and your health care provider tells you that you are at risk for this type of infection.  Your sexual activity has changed  since you were last screened and you are at an increased risk for chlamydia or gonorrhea. Ask your health care provider if you are at risk.  If you are at risk of being infected with HIV, it is recommended that you take a prescription medicine daily to prevent HIV infection. This is called preexposure prophylaxis (PrEP). You are considered at risk if:  You are sexually active and do not regularly use condoms or know the HIV status of your partner(s).  You take drugs by injection.  You are sexually active with a partner who has HIV.  Talk with your health care provider about whether you are at high risk of being infected with HIV. If   you choose to begin PrEP, you should first be tested for HIV. You should then be tested every 3 months for as long as you are taking PrEP.  Osteoporosis is a disease in which the bones lose minerals and strength with aging. This can result in serious bone fractures or breaks. The risk of osteoporosis can be identified using a bone density scan. Women ages 67 years and over and women at risk for fractures or osteoporosis should discuss screening with their health care providers. Ask your health care provider whether you should take a calcium supplement or vitamin D to reduce the rate of osteoporosis.  Menopause can be associated with physical symptoms and risks. Hormone replacement therapy is available to decrease symptoms and risks. You should talk to your health care provider about whether hormone replacement therapy is right for you.  Use sunscreen. Apply sunscreen liberally and repeatedly throughout the day. You should seek shade when your shadow is shorter than you. Protect yourself by wearing long sleeves, pants, a wide-brimmed hat, and sunglasses year round, whenever you are outdoors.  Once a month, do a whole body skin exam, using a mirror to look at the skin on your back. Tell your health care provider of new moles, moles that have irregular borders, moles that  are larger than a pencil eraser, or moles that have changed in shape or color.  Stay current with required vaccines (immunizations).  Influenza vaccine. All adults should be immunized every year.  Tetanus, diphtheria, and acellular pertussis (Td, Tdap) vaccine. Pregnant women should receive 1 dose of Tdap vaccine during each pregnancy. The dose should be obtained regardless of the length of time since the last dose. Immunization is preferred during the 27th-36th week of gestation. An adult who has not previously received Tdap or who does not know her vaccine status should receive 1 dose of Tdap. This initial dose should be followed by tetanus and diphtheria toxoids (Td) booster doses every 10 years. Adults with an unknown or incomplete history of completing a 3-dose immunization series with Td-containing vaccines should begin or complete a primary immunization series including a Tdap dose. Adults should receive a Td booster every 10 years.  Varicella vaccine. An adult without evidence of immunity to varicella should receive 2 doses or a second dose if she has previously received 1 dose. Pregnant females who do not have evidence of immunity should receive the first dose after pregnancy. This first dose should be obtained before leaving the health care facility. The second dose should be obtained 4-8 weeks after the first dose.  Human papillomavirus (HPV) vaccine. Females aged 13-26 years who have not received the vaccine previously should obtain the 3-dose series. The vaccine is not recommended for use in pregnant females. However, pregnancy testing is not needed before receiving a dose. If a female is found to be pregnant after receiving a dose, no treatment is needed. In that case, the remaining doses should be delayed until after the pregnancy. Immunization is recommended for any person with an immunocompromised condition through the age of 61 years if she did not get any or all doses earlier. During the  3-dose series, the second dose should be obtained 4-8 weeks after the first dose. The third dose should be obtained 24 weeks after the first dose and 16 weeks after the second dose.  Zoster vaccine. One dose is recommended for adults aged 30 years or older unless certain conditions are present.  Measles, mumps, and rubella (MMR) vaccine. Adults born  before 1957 generally are considered immune to measles and mumps. Adults born in 1957 or later should have 1 or more doses of MMR vaccine unless there is a contraindication to the vaccine or there is laboratory evidence of immunity to each of the three diseases. A routine second dose of MMR vaccine should be obtained at least 28 days after the first dose for students attending postsecondary schools, health care workers, or international travelers. People who received inactivated measles vaccine or an unknown type of measles vaccine during 1963-1967 should receive 2 doses of MMR vaccine. People who received inactivated mumps vaccine or an unknown type of mumps vaccine before 1979 and are at high risk for mumps infection should consider immunization with 2 doses of MMR vaccine. For females of childbearing age, rubella immunity should be determined. If there is no evidence of immunity, females who are not pregnant should be vaccinated. If there is no evidence of immunity, females who are pregnant should delay immunization until after pregnancy. Unvaccinated health care workers born before 1957 who lack laboratory evidence of measles, mumps, or rubella immunity or laboratory confirmation of disease should consider measles and mumps immunization with 2 doses of MMR vaccine or rubella immunization with 1 dose of MMR vaccine.  Pneumococcal 13-valent conjugate (PCV13) vaccine. When indicated, a person who is uncertain of his immunization history and has no record of immunization should receive the PCV13 vaccine. All adults 65 years of age and older should receive this  vaccine. An adult aged 19 years or older who has certain medical conditions and has not been previously immunized should receive 1 dose of PCV13 vaccine. This PCV13 should be followed with a dose of pneumococcal polysaccharide (PPSV23) vaccine. Adults who are at high risk for pneumococcal disease should obtain the PPSV23 vaccine at least 8 weeks after the dose of PCV13 vaccine. Adults older than 38 years of age who have normal immune system function should obtain the PPSV23 vaccine dose at least 1 year after the dose of PCV13 vaccine.  Pneumococcal polysaccharide (PPSV23) vaccine. When PCV13 is also indicated, PCV13 should be obtained first. All adults aged 65 years and older should be immunized. An adult younger than age 65 years who has certain medical conditions should be immunized. Any person who resides in a nursing home or long-term care facility should be immunized. An adult smoker should be immunized. People with an immunocompromised condition and certain other conditions should receive both PCV13 and PPSV23 vaccines. People with human immunodeficiency virus (HIV) infection should be immunized as soon as possible after diagnosis. Immunization during chemotherapy or radiation therapy should be avoided. Routine use of PPSV23 vaccine is not recommended for American Indians, [place] Natives, or people younger than 65 years unless there are medical conditions that require PPSV23 vaccine. When indicated, people who have unknown immunization and have no record of immunization should receive PPSV23 vaccine. One-time revaccination 5 years after the first dose of PPSV23 is recommended for people aged 19-64 years who have chronic kidney failure, nephrotic syndrome, asplenia, or immunocompromised conditions. People who received 1-2 doses of PPSV23 before age 65 years should receive another dose of PPSV23 vaccine at age 65 years or later if at least 5 years have passed since the previous dose. Doses of PPSV23 are not  needed for people immunized with PPSV23 at or after age 65 years.  Meningococcal vaccine. Adults with asplenia or persistent complement component deficiencies should receive 2 doses of quadrivalent meningococcal conjugate (MenACWY-D) vaccine. The doses should be obtained   at least 2 months apart. Microbiologists working with certain meningococcal bacteria, [place] recruits, people at risk during an outbreak, and people who travel to or live in countries with a high rate of meningitis should be immunized. A first-year college student up through age 34 years who is living in a residence hall should receive a dose if she did not receive a dose on or after her 16th birthday. Adults who have certain high-risk conditions should receive one or more doses of vaccine.  Hepatitis A vaccine. Adults who wish to be protected from this disease, have certain high-risk conditions, work with hepatitis A-infected animals, work in hepatitis A research labs, or travel to or work in countries with a high rate of hepatitis A should be immunized. Adults who were previously unvaccinated and who anticipate close contact with an international adoptee during the first 60 days after arrival in the [place] from a country with a high rate of hepatitis A should be immunized.  Hepatitis B vaccine. Adults who wish to be protected from this disease, have certain high-risk conditions, may be exposed to blood or other infectious body fluids, are household contacts or sex partners of hepatitis B positive people, are clients or workers in certain care facilities, or travel to or work in countries with a high rate of hepatitis B should be immunized.  Haemophilus influenzae type b (Hib) vaccine. A previously unvaccinated person with asplenia or sickle cell disease or having a scheduled splenectomy should receive 1 dose of Hib vaccine. Regardless of previous immunization, a recipient of a hematopoietic stem cell transplant should receive a  3-dose series 6-12 months after her successful transplant. Hib vaccine is not recommended for adults with HIV infection. Preventive Services / Frequency Ages 35 to 4 years  Blood pressure check.** / Every 3-5 years.  Lipid and cholesterol check.** / Every 5 years beginning at age 60.  Clinical breast exam.** / Every 3 years for women in their 71s and 10s.  BRCA-related cancer risk assessment.** / For women who have family members with a BRCA-related cancer (breast, ovarian, tubal, or peritoneal cancers).  Pap test.** / Every 2 years from ages 76 through 26. Every 3 years starting at age 61 through age 76 or 93 with a history of 3 consecutive normal Pap tests.  HPV screening.** / Every 3 years from ages 37 through ages 60 to 51 with a history of 3 consecutive normal Pap tests.  Hepatitis C blood test.** / For any individual with known risks for hepatitis C.  Skin self-exam. / Monthly.  Influenza vaccine. / Every year.  Tetanus, diphtheria, and acellular pertussis (Tdap, Td) vaccine.** / Consult your health care provider. Pregnant women should receive 1 dose of Tdap vaccine during each pregnancy. 1 dose of Td every 10 years.  Varicella vaccine.** / Consult your health care provider. Pregnant females who do not have evidence of immunity should receive the first dose after pregnancy.  HPV vaccine. / 3 doses over 6 months, if 93 and younger. The vaccine is not recommended for use in pregnant females. However, pregnancy testing is not needed before receiving a dose.  Measles, mumps, rubella (MMR) vaccine.** / You need at least 1 dose of MMR if you were born in 1957 or later. You may also need a 2nd dose. For females of childbearing age, rubella immunity should be determined. If there is no evidence of immunity, females who are not pregnant should be vaccinated. If there is no evidence of immunity, females who are  pregnant should delay immunization until after pregnancy.  Pneumococcal  13-valent conjugate (PCV13) vaccine.** / Consult your health care provider.  Pneumococcal polysaccharide (PPSV23) vaccine.** / 1 to 2 doses if you smoke cigarettes or if you have certain conditions.  Meningococcal vaccine.** / 1 dose if you are age 68 to 8 years and a Market researcher living in a residence hall, or have one of several medical conditions, you need to get vaccinated against meningococcal disease. You may also need additional booster doses.  Hepatitis A vaccine.** / Consult your health care provider.  Hepatitis B vaccine.** / Consult your health care provider.  Haemophilus influenzae type b (Hib) vaccine.** / Consult your health care provider. Ages 7 to 53 years  Blood pressure check.** / Every year.  Lipid and cholesterol check.** / Every 5 years beginning at age 25 years.  Lung cancer screening. / Every year if you are aged 11-80 years and have a 30-pack-year history of smoking and currently smoke or have quit within the past 15 years. Yearly screening is stopped once you have quit smoking for at least 15 years or develop a health problem that would prevent you from having lung cancer treatment.  Clinical breast exam.** / Every year after age 48 years.  BRCA-related cancer risk assessment.** / For women who have family members with a BRCA-related cancer (breast, ovarian, tubal, or peritoneal cancers).  Mammogram.** / Every year beginning at age 41 years and continuing for as long as you are in good health. Consult with your health care provider.  Pap test.** / Every 3 years starting at age 65 years through age 37 or 70 years with a history of 3 consecutive normal Pap tests.  HPV screening.** / Every 3 years from ages 72 years through ages 60 to 40 years with a history of 3 consecutive normal Pap tests.  Fecal occult blood test (FOBT) of stool. / Every year beginning at age 21 years and continuing until age 5 years. You may not need to do this test if you get  a colonoscopy every 10 years.  Flexible sigmoidoscopy or colonoscopy.** / Every 5 years for a flexible sigmoidoscopy or every 10 years for a colonoscopy beginning at age 35 years and continuing until age 48 years.  Hepatitis C blood test.** / For all people born from [birth year] through 1965 and any individual with known risks for hepatitis C.  Skin self-exam. / Monthly.  Influenza vaccine. / Every year.  Tetanus, diphtheria, and acellular pertussis (Tdap/Td) vaccine.** / Consult your health care provider. Pregnant women should receive 1 dose of Tdap vaccine during each pregnancy. 1 dose of Td every 10 years.  Varicella vaccine.** / Consult your health care provider. Pregnant females who do not have evidence of immunity should receive the first dose after pregnancy.  Zoster vaccine.** / 1 dose for adults aged 30 years or older.  Measles, mumps, rubella (MMR) vaccine.** / You need at least 1 dose of MMR if you were born in 1957 or later. You may also need a second dose. For females of childbearing age, rubella immunity should be determined. If there is no evidence of immunity, females who are not pregnant should be vaccinated. If there is no evidence of immunity, females who are pregnant should delay immunization until after pregnancy.  Pneumococcal 13-valent conjugate (PCV13) vaccine.** / Consult your health care provider.  Pneumococcal polysaccharide (PPSV23) vaccine.** / 1 to 2 doses if you smoke cigarettes or if you have certain conditions.  Meningococcal vaccine.** /  Consult your health care provider.  Hepatitis A vaccine.** / Consult your health care provider.  Hepatitis B vaccine.** / Consult your health care provider.  Haemophilus influenzae type b (Hib) vaccine.** / Consult your health care provider. Ages 64 years and over  Blood pressure check.** / Every year.  Lipid and cholesterol check.** / Every 5 years beginning at age 23 years.  Lung cancer screening. / Every year if you  are aged 16-80 years and have a 30-pack-year history of smoking and currently smoke or have quit within the past 15 years. Yearly screening is stopped once you have quit smoking for at least 15 years or develop a health problem that would prevent you from having lung cancer treatment.  Clinical breast exam.** / Every year after age 74 years.  BRCA-related cancer risk assessment.** / For women who have family members with a BRCA-related cancer (breast, ovarian, tubal, or peritoneal cancers).  Mammogram.** / Every year beginning at age 44 years and continuing for as long as you are in good health. Consult with your health care provider.  Pap test.** / Every 3 years starting at age 58 years through age 22 or 39 years with 3 consecutive normal Pap tests. Testing can be stopped between 65 and 70 years with 3 consecutive normal Pap tests and no abnormal Pap or HPV tests in the past 10 years.  HPV screening.** / Every 3 years from ages 64 years through ages 70 or 61 years with a history of 3 consecutive normal Pap tests. Testing can be stopped between 65 and 70 years with 3 consecutive normal Pap tests and no abnormal Pap or HPV tests in the past 10 years.  Fecal occult blood test (FOBT) of stool. / Every year beginning at age 40 years and continuing until age 27 years. You may not need to do this test if you get a colonoscopy every 10 years.  Flexible sigmoidoscopy or colonoscopy.** / Every 5 years for a flexible sigmoidoscopy or every 10 years for a colonoscopy beginning at age 7 years and continuing until age 32 years.  Hepatitis C blood test.** / For all people born from [birth year] through 1965 and any individual with known risks for hepatitis C.  Osteoporosis screening.** / A one-time screening for women ages 30 years and over and women at risk for fractures or osteoporosis.  Skin self-exam. / Monthly.  Influenza vaccine. / Every year.  Tetanus, diphtheria, and acellular pertussis (Tdap/Td)  vaccine.** / 1 dose of Td every 10 years.  Varicella vaccine.** / Consult your health care provider.  Zoster vaccine.** / 1 dose for adults aged 35 years or older.  Pneumococcal 13-valent conjugate (PCV13) vaccine.** / Consult your health care provider.  Pneumococcal polysaccharide (PPSV23) vaccine.** / 1 dose for all adults aged 46 years and older.  Meningococcal vaccine.** / Consult your health care provider.  Hepatitis A vaccine.** / Consult your health care provider.  Hepatitis B vaccine.** / Consult your health care provider.  Haemophilus influenzae type b (Hib) vaccine.** / Consult your health care provider. ** Family history and personal history of risk and conditions may change your health care provider's recommendations.   This information is not intended to replace advice given to you by your health care provider. Make sure you discuss any questions you have with your health care provider.   Document Released: 11/10/2001 Document Revised: 10/05/2014 Document Reviewed: 02/09/2011 Elsevier Interactive Patient Education [employer].

## 2016-02-17 NOTE — Progress Notes (Signed)
GYNECOLOGY CLINIC ANNUAL PREVENTATIVE CARE ENCOUNTER NOTE  Subjective:   Adrienne Wood is a 40 y.o. G6P0010 female here for a routine annual gynecologic exam.  Current complaints: none.   Denies abnormal vaginal bleeding, discharge, pelvic pain, problems with intercourse or other gynecologic concerns.    Gynecologic History Patient's last menstrual period was 02/06/2016. Contraception: condoms Last Pap: 04/25/14. Results were: normal with negative HRHPV.  Obstetric History OB History  Gravida Para Term Preterm AB SAB TAB Ectopic Multiple Living  1 0 0 0 1 0 1   0    # Outcome Date GA Lbr Len/2nd Weight Sex Delivery Anes PTL Lv  1 TAB 2004              Past Medical History  Diagnosis Date  . Abnormal Pap smear     Past Surgical History  Procedure Laterality Date  . Cleft lip repair    . Leep      Current Outpatient Prescriptions on File Prior to Visit  Medication Sig Dispense Refill  . metroNIDAZOLE (FLAGYL) 500 MG tablet Take 1 tablet (500 mg total) by mouth 2 (two) times daily. (Patient not taking: Reported on 02/17/2016) 14 tablet 0   No current facility-administered medications on file prior to visit.    No Known Allergies  Social History   Social History  . Marital Status: Single    Spouse Name: N/A  . Number of Children: N/A  . Years of Education: N/A   Occupational History  . Not on file.   Social History Main Topics  . Smoking status: Never Smoker   . Smokeless tobacco: Never Used  . Alcohol Use: Yes     Comment: occasional  . Drug Use: No  . Sexual Activity: Not Currently    Birth Control/ Protection: None   Other Topics Concern  . Not on file   Social History Narrative    History reviewed. No pertinent family history.  The following portions of the patient's history were reviewed and updated as appropriate: allergies, current medications, past family history, past medical history, past social history, past surgical history and  problem list.  Review of Systems Pertinent items noted in HPI and remainder of comprehensive ROS otherwise negative.   Objective:  BP 129/84 mmHg  Pulse 72  Temp(Src) 98 F (36.7 C) (Oral)  Resp 18  Ht  (1.549 m)  Wt 139 lb 12.8 oz (63.413 kg)  BMI 26.43 kg/m2  SpO2 100%  LMP 02/06/2016 CONSTITUTIONAL: Well-developed, well-nourished female in no acute distress.  HENT:  Normocephalic, atraumatic, External right and left ear normal. Oropharynx is clear and moist EYES: Conjunctivae and EOM are normal. Pupils are equal, round, and reactive to light. No scleral icterus.  NECK: Normal range of motion, supple, no masses.  Normal thyroid.  SKIN: Skin is warm and dry. No rash noted. Not diaphoretic. No erythema. No pallor. NEUROLOGIC: Alert and oriented to person, place, and time. Normal reflexes, muscle tone coordination. No cranial nerve deficit noted. PSYCHIATRIC: Normal mood and affect. Normal behavior. Normal judgment and thought content. CARDIOVASCULAR: Normal heart rate noted, regular rhythm RESPIRATORY: Clear to auscultation bilaterally. Effort and breath sounds normal, no problems with respiration noted. BREASTS: Symmetric in size. No masses, skin changes, nipple drainage, or lymphadenopathy. ABDOMEN: Soft, normal bowel sounds, no distention noted.  No tenderness, rebound or guarding.  PELVIC: Normal appearing external genitalia; normal appearing vaginal mucosa and cervix.  No abnormal discharge noted.  Pap smear obtained.  Normal  uterine size, no other palpable masses, no uterine or adnexal tenderness. MUSCULOSKELETAL: Normal range of motion. No tenderness.  No cyanosis, clubbing, or edema.  2+ distal pulses.   Assessment:  Annual gynecologic examination with pap smear   Plan:  Will follow up results of pap smear and manage accordingly. Routine preventative health maintenance measures emphasized. Please refer to After Visit Summary for other counseling recommendations.     Jaynie CollinsUGONNA  Yue Glasheen, MD, FACOG Attending Obstetrician & Gynecologist, [facility]

## 2016-02-19 LAB — CYTOLOGY - PAP

## 2016-12-10 ENCOUNTER — Encounter: Payer: Self-pay | Admitting: Obstetrics & Gynecology

## 2018-01-17 ENCOUNTER — Ambulatory Visit: Payer: Self-pay | Admitting: Obstetrics & Gynecology

## 2018-01-19 ENCOUNTER — Telehealth: Payer: Self-pay | Admitting: General Practice

## 2018-01-19 NOTE — Telephone Encounter (Signed)
Patient called and left message on nurse line stating she had an appt yesterday @ 1355 and was refused to be seen due to no insurance. Patient is requesting a call back. Called patient back and she states for years she has came in to the office without insurance, had an annual exam and filled out a financial application after the fact & the visit was covered. Discussed with patient that we are trying not to utilize the financial assistance program through [facility] for preventative care but for patients that are sick/needing treatment for surgery and things like that. Told patient [facility] partners with a program from the state to provide free pap screenings & STD testing and the benefit of that is if something is abnormal and requires follow up it is paid for. Discussed I could refer her to the program and they will take care of her pap smear and will also set up a mammogram appt. Also discussed she is welcome to have an appt in our office as well, she will just need to pay out of pocket. Patient verbalized understanding & requests referral to [facility]. Will email the program. Patient had no other questions

## 2018-07-25 ENCOUNTER — Encounter: Payer: Self-pay | Admitting: Internal Medicine

## 2018-07-25 ENCOUNTER — Ambulatory Visit (INDEPENDENT_AMBULATORY_CARE_PROVIDER_SITE_OTHER): Payer: Self-pay | Admitting: Internal Medicine

## 2018-07-25 VITALS — BP 132/78 | HR 70 | Ht 61.0 in | Wt 154.2 lb

## 2018-07-25 DIAGNOSIS — N898 Other specified noninflammatory disorders of vagina: Secondary | ICD-10-CM

## 2018-07-25 NOTE — Patient Instructions (Signed)
The blister that you noticed appears to be an ingrown hair. I would apply warm compresses to the area 4x per day for 15 minutes at a time. If it doesn't improve or you notice spreading redness, develop fevers, feel nausea/have vomiting you need to be seen again.

## 2018-07-25 NOTE — Progress Notes (Signed)
Pt noticed a blister @ her groin area (panty line) towards the back.

## 2018-08-01 NOTE — Progress Notes (Signed)
   Subjective:    Adrienne Wood - 42 y.o. female MRN [id number]  Date of birth: 1976-05-27  HPI  Adrienne Wood is a 42 y.o. G52P0010 female here for vaginal lesion. Reports painful blister like lesion on her groin near her bottom that she noticed a few days ago. It rubs against her panty line and is uncomfortable. Has never had a lesion like this prior and has not noticed any other new lesions. She denies drainage or bleeding from the area. Denies vaginal discharge, pelvic pain, fevers, and vomiting.  She does shave her pubic hair about once per week. Not concerned for STD.     OB History    Gravida  1   Para  0   Term  0   Preterm  0   AB  1   Living  0     SAB  0   TAB  1   Ectopic      Multiple      Live Births                 -  reports that she has never smoked. She has never used smokeless tobacco. - Review of Systems: Per HPI. - Past Medical History: Patient Active Problem List   Diagnosis Date Noted  . 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 External genitalia within normal limits. Has a small area of induration over the right inner, upper thigh near the labia. Lesion is not ulcerated or vesicular. No fluctuance appreciated. Approximately 1/4 cm of hair growth in pubic region.  Vaginal mucosa pink, moist, normal rugae.  Nonfriable cervix without lesions, no discharge or bleeding noted on speculum exam.   Assessment & Plan:    1. Vaginal lesion Appears most consistent with an ingrown hair due to recent shaving, location and small area of induration. Lesion is painful and does not appear ulcerated so low  suspicion for primary syphilis. Only one isolated lesion and not vesicular so low suspicion for herpetic lesion. Does not appear consistent with genital wart or skin tag. As area is small and indurated do not believe I&D would be beneficial. Have recommended warm compresses to the area. No sign of secondary skin infection warranting treatment with antibiotic. If does not resolve in the next 3 days patient to return for further evaluation and would consider testing for STDs at that point.   Routine preventative health maintenance measures emphasized. Please refer to After Visit Summary for other counseling recommendations.   No follow-ups on file.  Marcy Siren, D.O. OB Fellow  08/01/2018, 1:53 PM

## 2018-10-18 ENCOUNTER — Ambulatory Visit: Payer: Self-pay | Admitting: Family Medicine

## 2018-12-05 ENCOUNTER — Other Ambulatory Visit: Payer: Self-pay | Admitting: Obstetrics & Gynecology

## 2018-12-05 DIAGNOSIS — Z124 Encounter for screening for malignant neoplasm of cervix: Secondary | ICD-10-CM

## 2018-12-05 NOTE — Addendum Note (Signed)
Addended by: Lynnell Dike on: 12/05/2018 06:46 PM   Modules accepted: Orders

## 2018-12-05 NOTE — Progress Notes (Signed)
Patient: Adrienne Wood           Date of Birth: 01-May-1976           MRN: [id number] Visit Date: 12/05/2018 PCP: Patient, No Pcp Per  Cervical Cancer Screening Do you smoke?: No Have you ever had or been told you have an allergy to latex products?: No Marital status: Single Date of last pap smear: 2-5 yrs ago Date of last menstrual period: 11/15/18 Number of pregnancies: 1 Number of births: 0 Have you ever had any of the following? Hysterectomy: No Tubal ligation (tubes tied): No Abnormal bleeding: No Abnormal pap smear: Yes Venereal warts: No A sex partner with venereal warts: No A high risk* sex partner: No  Cervical Exam  Abnormal Observations: None Recommendations: Pap smear and treat based on resutls      Patient's History Patient Active Problem List   Diagnosis Date Noted  . 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 Abnormal Pap smear     Family History  Problem Relation Age of Onset  . Hypertension Mother   . Plantar fasciitis Mother     Social History   Occupational History  . Not on file  Tobacco Use  . Smoking status: Never Smoker  . Smokeless tobacco: Never Used  Substance and Sexual Activity  . Alcohol use: Yes    Comment: occasional  . Drug use: No  . Sexual activity: Not Currently    Birth control/protection: None

## 2018-12-12 LAB — CYTOLOGY - PAP: Diagnosis: NEGATIVE

## 2018-12-26 ENCOUNTER — Telehealth (HOSPITAL_COMMUNITY): Payer: Self-pay | Admitting: *Deleted

## 2018-12-26 NOTE — Telephone Encounter (Signed)
Normal Pap smear result letter mailed to patient by Cytology. 

## 2020-07-15 ENCOUNTER — Encounter: Payer: Self-pay | Admitting: Certified Nurse Midwife

## 2020-07-15 ENCOUNTER — Ambulatory Visit (INDEPENDENT_AMBULATORY_CARE_PROVIDER_SITE_OTHER): Payer: Self-pay | Admitting: Certified Nurse Midwife

## 2020-07-15 ENCOUNTER — Other Ambulatory Visit (HOSPITAL_COMMUNITY)
Admission: RE | Admit: 2020-07-15 | Discharge: 2020-07-15 | Disposition: A | Discharge status: Home / Self Care | Payer: Medicaid Other | Source: Ambulatory Visit | Attending: Certified Nurse Midwife | Admitting: Certified Nurse Midwife

## 2020-07-15 ENCOUNTER — Other Ambulatory Visit: Payer: Self-pay

## 2020-07-15 ENCOUNTER — Telehealth: Payer: Self-pay

## 2020-07-15 VITALS — BP 134/87 | HR 86 | Ht 61.0 in | Wt 172.4 lb

## 2020-07-15 DIAGNOSIS — N939 Abnormal uterine and vaginal bleeding, unspecified: Secondary | ICD-10-CM | POA: Diagnosis not present

## 2020-07-15 DIAGNOSIS — N852 Hypertrophy of uterus: Secondary | ICD-10-CM

## 2020-07-15 NOTE — Progress Notes (Signed)
History:  Adrienne Wood is a 44 y.o. G1P0010 who presents to clinic today for abnormal uterine bleeding. Patient reports AUB started occurring in August. Since August has been having multiple cycles a month. Patient had cycle on 8/25-9/5, 9/9-9/18, then started spotting 9/21-9/29, then October 1st started having moderate/heavy bleeding then stopped and has not had bleeding since 10/8.     She denies abdominal pain or pelvic pain. She denies any other GYN concerns.   The following portions of the patient's history were reviewed and updated as appropriate: allergies, current medications, family history, past medical history, social history, past surgical history and problem list.  Review of Systems:  Review of Systems  Constitutional: Negative.   Respiratory: Negative.   Cardiovascular: Negative.   Gastrointestinal: Negative.   Genitourinary: Negative.   Neurological: Negative.   Psychiatric/Behavioral: Negative.      Objective:  Physical Exam BP 134/87   Pulse 86   Ht 5\' 1"  (1.549 m)   Wt 172 lb 6.4 oz (78.2 kg)   BMI 32.57 kg/m  Physical Exam Vitals reviewed.  HENT:     Head: Normocephalic.  Cardiovascular:     Rate and Rhythm: Normal rate and regular rhythm.  Pulmonary:     Effort: Pulmonary effort is normal. No respiratory distress.     Breath sounds: Normal breath sounds. No wheezing.  Abdominal:     Palpations: Abdomen is soft. There is no mass.     Tenderness: There is no abdominal tenderness. There is no guarding.  Genitourinary:    Uterus: Enlarged. Not deviated and not tender.   Skin:    General: Skin is warm and dry.  Neurological:     Mental Status: She is alert and oriented to person, place, and time.  Psychiatric:        Mood and Affect: Mood normal.        Behavior: Behavior normal.        Thought Content: Thought content normal.     Assessment & Plan:  1. Abnormal uterine bleeding (AUB) - cause of AUB perimenopause vs fibroids?  -  Cervicovaginal ancillary only( [place]) - Pelvis Complete; Future - TSH - Prolactin - FSH - Discussed with patient Megace for AUB - patient declines at this time while results pending, encouraged to call office if patient changes mind   2. Enlarged uterus - uterus enlarged to  - US Pelvis Complete; Future   [place], CNM 07/15/2020 4:32 PM

## 2020-07-15 NOTE — Telephone Encounter (Signed)
Called Pt. To inform of [place]  appt on 07/29/2020 @ 3 pm. Arrive at 2:45 with full bladder , Pt verbalized understanding.

## 2020-07-15 NOTE — Patient Instructions (Signed)
Abnormal Uterine Bleeding Abnormal uterine bleeding means bleeding more than usual from your uterus. It can include:  Bleeding between periods.  Bleeding after sex.  Bleeding that is heavier than normal.  Periods that last longer than usual.  Bleeding after you have stopped having your period (menopause). There are many problems that may cause this. You should see a doctor for any kind of bleeding that is not normal. Treatment depends on the cause of the bleeding. Follow these instructions at home:  Watch your condition for any changes.  Do not use tampons, douche, or have sex, if your doctor tells you not to.  Change your pads often.  Get regular well-woman exams. Make sure they include a pelvic exam and cervical cancer screening.  Keep all follow-up visits as told by your doctor. This is important. Contact a doctor if:  The bleeding lasts more than one week.  You feel dizzy at times.  You feel like you are going to throw up (nauseous).  You throw up. Get help right away if:  You pass out.  You have to change pads every hour.  You have belly (abdominal) pain.  You have a fever.  You get sweaty.  You get weak.  You passing large blood clots from your vagina. Summary  Abnormal uterine bleeding means bleeding more than usual from your uterus.  There are many problems that may cause this. You should see a doctor for any kind of bleeding that is not normal.  Treatment depends on the cause of the bleeding. This information is not intended to replace advice given to you by your health care provider. Make sure you discuss any questions you have with your health care provider. Document Revised: 09/08/2016 Document Reviewed: 09/08/2016 Elsevier Patient Education  2020 [employer]   Uterine Fibroids  Uterine fibroids (leiomyomas) are noncancerous (benign) tumors that can develop in the uterus. Fibroids may also develop in the fallopian tubes, cervix, or tissues  (ligaments) near the uterus. You may have one or many fibroids. Fibroids vary in size, weight, and where they grow in the uterus. Some can become quite large. Most fibroids do not require medical treatment. What are the causes? The cause of this condition is not known. What increases the risk? You are more likely to develop this condition if you:  Are in your 30s or 40s and have not gone through menopause.  Have a family history of this condition.  Are of African-American descent.  Had your first period at an early age (early menarche).  Have not had any children (nulliparity).  Are overweight or obese. What are the signs or symptoms? Many women do not have any symptoms. Symptoms of this condition may include:  Heavy menstrual bleeding.  Bleeding or spotting between periods.  Pain and pressure in the pelvic area, between the hips.  Bladder problems, such as needing to urinate urgently or more often than usual.  Inability to have children (infertility).  Failure to carry pregnancy to term (miscarriage). How is this diagnosed? This condition may be diagnosed based on:  Your symptoms and medical history.  A physical exam.  A pelvic exam that includes feeling for any tumors.  Imaging tests, such as ultrasound or MRI. How is this treated? Treatment for this condition may include:  Seeing your health care provider for follow-up visits to monitor your fibroids for any changes.  Taking NSAIDs such as ibuprofen, naproxen, or aspirin to reduce pain.  Hormone medicines. These may be taken as a  pill, given in an injection, or delivered by a T-shaped device that is inserted into the uterus (intrauterine device, IUD).  Surgery to remove one of the following: ? The fibroids (myomectomy). Your health care provider may recommend this if fibroids affect your fertility and you want to become pregnant. ? The uterus (hysterectomy). ? Blood supply to the fibroids (uterine artery  embolization). Follow these instructions at home:  Take over-the-counter and prescription medicines only as told by your health care provider.  Ask your health care provider if you should take iron pills or eat more iron-rich foods, such as dark green, leafy vegetables. Heavy menstrual bleeding can cause low iron levels.  If directed, apply heat to your back or abdomen to reduce pain. Use the heat source that your health care provider recommends, such as a moist heat pack or a heating pad. ? Place a towel between your skin and the heat source. ? Leave the heat on for 20-30 minutes. ? Remove the heat if your skin turns bright red. This is especially important if you are unable to feel pain, heat, or cold. You may have a greater risk of getting burned.  Pay close attention to your menstrual cycle. Tell your health care provider about any changes, such as: ? Increased blood flow that requires you to use more pads or tampons than usual. ? A change in the number of days that your period lasts. ? A change in symptoms that are associated with your period, such as back pain or cramps in your abdomen.  Keep all follow-up visits as told by your health care provider. This is important, especially if your fibroids need to be monitored for any changes. Contact a health care provider if you:  Have pelvic pain, back pain, or cramps in your abdomen that do not get better with medicine or heat.  Develop new bleeding between periods.  Have increased bleeding during or between periods.  Feel unusually tired or weak.  Feel light-headed. Get help right away if you:  Faint.  Have pelvic pain that suddenly gets worse.  Have severe vaginal bleeding that soaks a tampon or pad in 30 minutes or less. Summary  Uterine fibroids are noncancerous (benign) tumors that can develop in the uterus.  The exact cause of this condition is not known.  Most fibroids do not require medical treatment unless they  affect your ability to have children (fertility).  Contact a health care provider if you have pelvic pain, back pain, or cramps in your abdomen that do not get better with medicines.  Make sure you know what symptoms should cause you to get help right away. This information is not intended to replace advice given to you by your health care provider. Make sure you discuss any questions you have with your health care provider. Document Revised: 08/27/2017 Document Reviewed: 08/10/2017 Elsevier Patient Education  2020 [employer].

## 2020-07-16 LAB — FOLLICLE STIMULATING HORMONE: FSH: 5.7 m[IU]/mL

## 2020-07-16 LAB — PROLACTIN: Prolactin: 15.8 ng/mL (ref 4.8–23.3)

## 2020-07-16 LAB — TSH: TSH: 2.92 u[IU]/mL (ref 0.450–4.500)

## 2020-07-17 LAB — CERVICOVAGINAL ANCILLARY ONLY
Chlamydia: NEGATIVE
Comment: NEGATIVE
Comment: NEGATIVE
Comment: NORMAL
Neisseria Gonorrhea: NEGATIVE
Trichomonas: NEGATIVE

## 2020-07-29 ENCOUNTER — Ambulatory Visit
Admission: RE | Admit: 2020-07-29 | Discharge: 2020-07-29 | Disposition: A | Discharge status: Home / Self Care | Payer: Medicaid Other | Source: Ambulatory Visit | Attending: Certified Nurse Midwife | Admitting: Certified Nurse Midwife

## 2020-07-29 ENCOUNTER — Other Ambulatory Visit: Payer: Self-pay

## 2020-07-29 DIAGNOSIS — N852 Hypertrophy of uterus: Secondary | ICD-10-CM | POA: Insufficient documentation

## 2020-07-29 DIAGNOSIS — N939 Abnormal uterine and vaginal bleeding, unspecified: Secondary | ICD-10-CM | POA: Insufficient documentation

## 2020-11-06 ENCOUNTER — Other Ambulatory Visit: Payer: Self-pay | Admitting: Lactation Services

## 2020-11-06 MED ORDER — TERCONAZOLE 0.4 % VA CREA: 1.0000 | TOPICAL_CREAM | Freq: Every day | VAGINAL | 0 refills | Status: DC

## 2021-01-27 ENCOUNTER — Ambulatory Visit (INDEPENDENT_AMBULATORY_CARE_PROVIDER_SITE_OTHER): Payer: Self-pay | Admitting: Student

## 2021-01-27 ENCOUNTER — Encounter: Payer: Self-pay | Admitting: Student

## 2021-01-27 ENCOUNTER — Ambulatory Visit: Payer: Medicaid Other | Admitting: Obstetrics and Gynecology

## 2021-01-27 ENCOUNTER — Other Ambulatory Visit: Payer: Self-pay

## 2021-01-27 VITALS — BP 130/89 | HR 86 | Ht 61.0 in | Wt 174.9 lb

## 2021-01-27 DIAGNOSIS — Z789 Other specified health status: Secondary | ICD-10-CM

## 2021-01-27 DIAGNOSIS — Z711 Person with feared health complaint in whom no diagnosis is made: Secondary | ICD-10-CM

## 2021-01-27 NOTE — Progress Notes (Signed)
Patient ID: Adrienne Wood, female   DOB: 05-Feb-1976, 45 y.o.   MRN: [id number]  History:  [name] is a 45 y.o. G1P0010 who presents to clinic today for assessment for knot in her groin. She has had it a few months; doesn't hurt. No redness, no blistering. She reports it was "in the middle" of her suprapubic area. She says it is not associated with shaving. It is not associated with her period.    She also wants to know if the fibroids were causing this lump. She has many questions about her [place] in November; says that she was not given results.    She reports that she also has a pain occasionally on her left side near her groin that comes and goes. Last time she felt it in the left lower quadrant was last week.  She first noticed it last summer at the beach; it is relieved with ibuprofen. It is not associated with her period. She notices it more at night. It She denies any pelvic surgery. No hysterectomy. She does not know if it is related to BM or not.   The following portions of the patient's history were reviewed and updated as appropriate: allergies, current medications, family history, past medical history, social history, past surgical history and problem list.  Review of Systems:  Review of Systems  Constitutional: Negative.   HENT: Negative.   Cardiovascular: Negative.   Gastrointestinal: Positive for abdominal pain.  Genitourinary: Negative.   Skin: Negative.   Neurological: Negative.   Psychiatric/Behavioral: Negative.   Knot in skin near groin    Objective:  Physical Exam BP 130/89   Pulse 86   Ht 5\' 1"  (1.549 m)   Wt 174 lb 14.4 oz (79.3 kg)   LMP 01/01/2021 (Exact Date)   BMI 33.05 kg/m  Physical Exam Constitutional:      Appearance: Normal appearance.  Cardiovascular:     Rate and Rhythm: Normal rate.     Pulses: Normal pulses.  Abdominal:     General: Abdomen is flat.  Neurological:     Mental Status: She is alert.  Psychiatric:         Mood and Affect: Mood normal.    Limited abdominal exam reveals no tenderness in LLQ, suprapubic area is soft, non-tender, no lumps, masses or ingrown hairs. Deep palpation over mons pubis reveals no lumps or masses.  Labs and Imaging No results found for this or any previous visit (from the past 24 hour(s)).  No results found.   Assessment & Plan:   1. Normal skin appearance    -reassured patient that occasional lump in groin may be cysts, lymph node, ingrown hair or lipoma. Unlikely to be cancerous as it is comes and goes and is not associated with any other symptoms.  -discussed anatomy of LLQ; most pain in LLQ is MSK or related to constipation/bowel movements.  -recommended that patient keep a diary to track when she noticed lump on her mons pubis and when she has LLQ pain; consider GI follow up.  -recommended colonoscopy in 2 years -reviewed 03/03/2021 with patient of small anterior uterine fibroid; explained that fibroid would not be palpated through the skin and that her fibroid is a benging finding. If she is not having heavy periods/painful periods there is no further work up necessary.   Approximately 30 minutes of total time was spent with this patient on care and examination .   [place], CNM 01/27/2021 4:01 PM

## 2021-01-27 NOTE — Progress Notes (Signed)
Patient is here because of a knot on pubic area (underneath the skin). Occasionally causes pain. Patient also complains of pain on the lower left side of abdomen. Denies any bleeding.

## 2021-05-10 ENCOUNTER — Emergency Department: Payer: Self-pay

## 2021-05-10 ENCOUNTER — Emergency Department
Admission: EM | Admit: 2021-05-10 | Discharge: 2021-05-10 | Disposition: A | Discharge status: Home / Self Care | Payer: Self-pay | Attending: Emergency Medicine | Admitting: Emergency Medicine

## 2021-05-10 ENCOUNTER — Other Ambulatory Visit: Payer: Self-pay

## 2021-05-10 DIAGNOSIS — R519 Headache, unspecified: Secondary | ICD-10-CM | POA: Insufficient documentation

## 2021-05-10 DIAGNOSIS — F419 Anxiety disorder, unspecified: Secondary | ICD-10-CM | POA: Insufficient documentation

## 2021-05-10 DIAGNOSIS — R002 Palpitations: Secondary | ICD-10-CM | POA: Insufficient documentation

## 2021-05-10 LAB — BASIC METABOLIC PANEL
Anion gap: 10 (ref 5–15)
BUN: 11 mg/dL (ref 6–20)
CO2: 22 mmol/L (ref 22–32)
Calcium: 9.4 mg/dL (ref 8.9–10.3)
Chloride: 106 mmol/L (ref 98–111)
Creatinine, Ser: 0.83 mg/dL (ref 0.44–1.00)
GFR, Estimated: >60 mL/min (ref >60)
Glucose, Bld: 114 mg/dL — ABNORMAL HIGH (ref 70–99)
Potassium: 3.6 mmol/L (ref 3.5–5.1)
Sodium: 138 mmol/L (ref 135–145)

## 2021-05-10 LAB — HEPATIC FUNCTION PANEL
ALT: 14 U/L (ref 0–44)
AST: 19 U/L (ref 15–41)
Albumin: 4.5 g/dL (ref 3.5–5.0)
Alkaline Phosphatase: 63 U/L (ref 38–126)
Bilirubin, Direct: <0.1 mg/dL (ref 0.0–0.2)
Total Bilirubin: 1.1 mg/dL (ref 0.3–1.2)
Total Protein: 8 g/dL (ref 6.5–8.1)

## 2021-05-10 LAB — POC URINE PREG, ED: Preg Test, Ur: NEGATIVE

## 2021-05-10 LAB — CBC
HCT: 39.9 % (ref 36.0–46.0)
Hemoglobin: 14.1 g/dL (ref 12.0–15.0)
MCH: 30.1 pg (ref 26.0–34.0)
MCHC: 35.3 g/dL (ref 30.0–36.0)
MCV: 85.1 fL (ref 80.0–100.0)
Platelets: 349 10*3/uL (ref 150–400)
RBC: 4.69 MIL/uL (ref 3.87–5.11)
RDW: 13 % (ref 11.5–15.5)
WBC: 11.8 10*3/uL — ABNORMAL HIGH (ref 4.0–10.5)
nRBC: 0 % (ref 0.0–0.2)

## 2021-05-10 LAB — TSH: TSH: 5.622 u[IU]/mL — ABNORMAL HIGH (ref 0.350–4.500)

## 2021-05-10 LAB — TROPONIN I (HIGH SENSITIVITY): Troponin I (High Sensitivity): 4 ng/L (ref <18)

## 2021-05-10 LAB — LIPASE, BLOOD: Lipase: 47 U/L (ref 11–51)

## 2021-05-10 LAB — MAGNESIUM: Magnesium: 2.3 mg/dL (ref 1.7–2.4)

## 2021-05-10 MED ORDER — HYDROXYZINE HCL 10 MG PO TABS: 10.0000 mg | ORAL_TABLET | Freq: Three times a day (TID) | ORAL | 0 refills | Status: DC | PRN

## 2021-05-10 MED ORDER — HYDROXYZINE HCL 10 MG PO TABS
10.0000 mg | ORAL_TABLET | Freq: Once | ORAL | Status: AC
  Administered 2021-05-10: 10 mg via ORAL
  Filled 2021-05-10 (×2): qty 1

## 2021-05-10 NOTE — ED Triage Notes (Signed)
Pt comes pov with "feeling like my heart is enlarged" and right sided headache for a few days. Was seen at PCP yesterday for same and sent home. States she has episodes of her heart racing at home.

## 2021-05-10 NOTE — ED Provider Notes (Signed)
[facility] Emergency Department Provider Note   ____________________________________________   Event Date/Time   First MD Initiated Contact with Patient 05/10/21 1159     (approximate)  I have reviewed the triage vital signs and the nursing notes.   HISTORY  Chief Complaint Chest Pain    HPI Adrienne Wood is a 45 y.o. female with no significant past medical history who presents to the ED complaining of headache and palpitations.  Patient reports that she has been dealing with intermittent right-sided headache for the past couple of months.  She describes it as a throbbing behind her right eye and along her right temple.  It is not exacerbated or alleviated by anything in particular, but is associated with numbness and tingling in both of her arms.  She is also been having intermittent episodes of palpitations for the past couple of weeks, denies associated chest pain or shortness of breath.  She has not had any fevers or cough, does state it feels like her "heart is enlarged."  She denies any pain or swelling in her legs.  She denies significant caffeine intake, was seen by her PCP for this problem yesterday and prescribed propranolol to take as needed, but states symptoms seem to worsen this morning.  Her LMP was approximately 1 month ago and she denies any vaginal bleeding, dysuria, or discharge.        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 Authorizing Provider  hydrOXYzine (ATARAX/VISTARIL) 10 MG tablet Take 1 tablet (10 mg total) by mouth every 8 (eight) hours as needed for anxiety. 05/10/21  Yes  Chesley Noon, MD  acetaminophen (TYLENOL) 325 MG tablet Take by mouth. 12/31/16   [provider]  azithromycin (ZITHROMAX) 250 MG tablet Take 250 mg by mouth as directed. 09/01/20   [provider]  Multiple Vitamin (MULTIVITAMIN) tablet Take 1 tablet by mouth daily.    [provider]  omeprazole (PRILOSEC) 40 MG capsule Take 1 capsule by mouth every morning. 10/15/20   [provider]  SUMAtriptan (IMITREX) 50 MG tablet Take 50 mg by mouth 2 (two) times daily as needed. 10/18/20   [provider]    Allergies Patient has no known allergies.  Family History  Problem Relation Age of Onset   Hypertension Mother    Plantar fasciitis Mother     Social History Social History   Tobacco Use   Smoking status: Never   Smokeless tobacco: Never  Substance Use Topics   Alcohol use: Yes    Comment: occasional   Drug use: No    Review of Systems  Constitutional: No fever/chills Eyes: No visual changes. ENT: No sore throat. Cardiovascular: Denies chest pain.  Positive for palpitations. Respiratory: Denies shortness of breath. Gastrointestinal: No abdominal pain.  No nausea, no vomiting.  No diarrhea.  No constipation. Genitourinary: Negative for dysuria. Musculoskeletal: Negative for back pain. Skin: Negative for rash. Neurological: Positive for headaches, negative for focal weakness, positive for numbness and tingling.  ____________________________________________   PHYSICAL EXAM:  VITAL SIGNS: ED Triage Vitals [05/10/21  0908]  Enc Vitals Group     BP (!) 139/101     Pulse Rate 80     Resp 18     Temp 98.6 F (37 C)     Temp Source Oral     SpO2 98 %     Weight 173 lb (78.5 kg)     Height 5\' 1"  (1.549 m)     Head Circumference      Peak Flow      Pain Score 4     Pain Loc      Pain Edu?      Excl. in GC?     Constitutional: Alert and oriented. Eyes: Conjunctivae are normal. Head: Atraumatic. Nose: No  congestion/rhinnorhea. Mouth/Throat: Mucous membranes are moist. Neck: Normal ROM Cardiovascular: Normal rate, regular rhythm. Grossly normal heart sounds.  2+ radial pulses bilaterally. Respiratory: Normal respiratory effort.  No retractions. Lungs CTAB. Gastrointestinal: Soft and nontender. No distention. Genitourinary: deferred Musculoskeletal: No lower extremity tenderness nor edema. Neurologic:  Normal speech and language. No gross focal neurologic deficits are appreciated. Skin:  Skin is warm, dry and intact. No rash noted. Psychiatric: Mood and affect are normal. Speech and behavior are normal.  ____________________________________________   LABS (all labs ordered are listed, but only abnormal results are displayed)  Labs Reviewed  BASIC METABOLIC PANEL - Abnormal; Notable for the following components:      Result Value   Glucose, Bld 114 (*)    All other components within normal limits  CBC - Abnormal; Notable for the following components:   WBC 11.8 (*)    All other components within normal limits  TSH - Abnormal; Notable for the following components:   TSH 5.622 (*)    All other components within normal limits  MAGNESIUM  HEPATIC FUNCTION PANEL  LIPASE, BLOOD  POC URINE PREG, ED  TROPONIN I (HIGH SENSITIVITY)   ____________________________________________  EKG  ED ECG REPORT I, , the attending physician, personally viewed and interpreted this ECG.   Date: 05/10/2021  EKG Time: 9:05  Rate: 74  Rhythm: normal sinus rhythm  Axis: Normal  Intervals:none  ST&T Change: None   PROCEDURES  Procedure(s) performed (including Critical Care):  Procedures   ____________________________________________   INITIAL IMPRESSION / ASSESSMENT AND PLAN / ED COURSE      45 year old female with no significant past medical history presents to the ED complaining of intermittent headache and palpitations for the past couple of weeks along with numbness and  tingling in her arms.  She has no focal neurologic deficits on exam, but given her reported neurologic symptoms with persistent headache we will check CT head.  EKG shows no evidence of arrhythmia or ischemia, labs thus far are reassuring, troponin within normal limits.  We will observe on cardiac monitor for any arrhythmia, add on TSH and magnesium levels.  TSH level is mildly elevated, I doubt hypothyroidism is playing a significant role in her symptoms today however she was counseled to follow-up with her PCP for recheck of thyroid function.  LFTs and lipase are within normal limits, patient is appropriate for discharge home with cardiology follow-up for Holter monitor.  She is requesting medication to help with anxiety and we will prescribe Atarax, she was counseled to follow-up with PCP for long-term management of anxiety.  Patient counseled to return to the ED for new worsening symptoms, patient agrees with plan.      ____________________________________________   FINAL CLINICAL IMPRESSION(S) / ED DIAGNOSES  Final  diagnoses:  Palpitations  Acute nonintractable headache, unspecified headache type  Anxiety     ED Discharge Orders          Ordered    hydrOXYzine (ATARAX/VISTARIL) 10 MG tablet  Every 8 hours PRN        05/10/21 1544             Note:  This document was prepared using Dragon voice recognition software and may include unintentional dictation errors.    Chesley Noon, MD 05/10/21 [phone]

## 2021-05-28 ENCOUNTER — Emergency Department
Admission: EM | Admit: 2021-05-28 | Discharge: 2021-05-28 | Disposition: A | Discharge status: Home / Self Care | Payer: Self-pay | Attending: Emergency Medicine | Admitting: Emergency Medicine

## 2021-05-28 ENCOUNTER — Emergency Department: Payer: Self-pay

## 2021-05-28 ENCOUNTER — Other Ambulatory Visit: Payer: Self-pay

## 2021-05-28 DIAGNOSIS — R1011 Right upper quadrant pain: Secondary | ICD-10-CM | POA: Insufficient documentation

## 2021-05-28 DIAGNOSIS — K8020 Calculus of gallbladder without cholecystitis without obstruction: Secondary | ICD-10-CM

## 2021-05-28 LAB — COMPREHENSIVE METABOLIC PANEL
ALT: 12 U/L (ref 0–44)
AST: 16 U/L (ref 15–41)
Albumin: 4.5 g/dL (ref 3.5–5.0)
Alkaline Phosphatase: 64 U/L (ref 38–126)
Anion gap: 8 (ref 5–15)
BUN: 10 mg/dL (ref 6–20)
CO2: 21 mmol/L — ABNORMAL LOW (ref 22–32)
Calcium: 9.3 mg/dL (ref 8.9–10.3)
Chloride: 107 mmol/L (ref 98–111)
Creatinine, Ser: 0.75 mg/dL (ref 0.44–1.00)
GFR, Estimated: >60 mL/min (ref >60)
Glucose, Bld: 127 mg/dL — ABNORMAL HIGH (ref 70–99)
Potassium: 4 mmol/L (ref 3.5–5.1)
Sodium: 136 mmol/L (ref 135–145)
Total Bilirubin: 0.8 mg/dL (ref 0.3–1.2)
Total Protein: 8.1 g/dL (ref 6.5–8.1)

## 2021-05-28 LAB — URINALYSIS, COMPLETE (UACMP) WITH MICROSCOPIC
Bacteria, UA: NONE SEEN
Bilirubin Urine: NEGATIVE
Glucose, UA: NEGATIVE mg/dL
Hgb urine dipstick: NEGATIVE
Ketones, ur: NEGATIVE mg/dL
Nitrite: NEGATIVE
Protein, ur: NEGATIVE mg/dL
Specific Gravity, Urine: 1.028 (ref 1.005–1.030)
pH: 5 (ref 5.0–8.0)

## 2021-05-28 LAB — CBC
HCT: 40.4 % (ref 36.0–46.0)
Hemoglobin: 14 g/dL (ref 12.0–15.0)
MCH: 29.5 pg (ref 26.0–34.0)
MCHC: 34.7 g/dL (ref 30.0–36.0)
MCV: 85.2 fL (ref 80.0–100.0)
Platelets: 332 10*3/uL (ref 150–400)
RBC: 4.74 MIL/uL (ref 3.87–5.11)
RDW: 12.4 % (ref 11.5–15.5)
WBC: 10 10*3/uL (ref 4.0–10.5)
nRBC: 0 % (ref 0.0–0.2)

## 2021-05-28 LAB — POC URINE PREG, ED: Preg Test, Ur: NEGATIVE

## 2021-05-28 LAB — LIPASE, BLOOD: Lipase: 53 U/L — ABNORMAL HIGH (ref 11–51)

## 2021-05-28 MED ORDER — HYOSCYAMINE SULFATE SL 0.125 MG SL SUBL: 0.1250 mg | SUBLINGUAL_TABLET | Freq: Four times a day (QID) | SUBLINGUAL | 0 refills | Status: DC | PRN

## 2021-05-28 NOTE — ED Triage Notes (Signed)
Pt here with abd pain that started this morning. Pt states the pain is right sided and does not radiate. Pt denies N/V/D. Pt last BM was today.

## 2021-05-28 NOTE — ED Notes (Signed)
Patient stable and discharged with all personal belongings and AVS. AVS and discharge instructions reviewed with patient and opportunity for questions provided.   

## 2021-05-28 NOTE — Discharge Instructions (Addendum)
Please call and schedule a surgical consultation.  Return to the ER for symptoms that change or worsen.

## 2021-05-28 NOTE — ED Provider Notes (Signed)
[facility] Emergency Department Provider Note  ____________________________________________   Event Date/Time   First MD Initiated Contact with Patient 05/28/21 1238     (approximate)  I have reviewed the triage vital signs and the nursing notes.   HISTORY  Chief Complaint Abdominal Pain    HPI Adrienne Wood is a 45 y.o. female presents emergency department with abdominal pain that started in the right side this morning.  Patient states pain does not radiate.  She has had no nausea vomiting or diarrhea.  No fever or chills.  Last BM was today.  Patient states she still has a gallbladder and appendix.  She became very concerned as the pain became sharp.  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 Authorizing Provider  acetaminophen (TYLENOL) 325 MG tablet Take by mouth. 12/31/16   [provider]  azithromycin (ZITHROMAX) 250 MG tablet Take 250 mg by mouth as directed. 09/01/20   [provider]  hydrOXYzine (ATARAX/VISTARIL) 10 MG tablet Take 1 tablet (10 mg total) by mouth every 8 (eight) hours as needed for anxiety. 05/10/21   Chesley Noon, MD  Multiple Vitamin (MULTIVITAMIN) tablet Take 1 tablet by mouth daily.    [provider]  omeprazole (PRILOSEC) 40 MG capsule Take 1 capsule by mouth every morning. 10/15/20   [provider]  SUMAtriptan (IMITREX) 50 MG tablet Take 50 mg by mouth 2 (two) times daily as needed. 10/18/20   [provider]    Allergies Patient has no known allergies.  Family History  Problem Relation Age of Onset   Hypertension Mother     Plantar fasciitis Mother     Social History Social History   Tobacco Use   Smoking status: Never   Smokeless tobacco: Never  Substance Use Topics   Alcohol use: Yes    Comment: occasional   Drug use: No    Review of Systems  Constitutional: No fever/chills Eyes: No visual changes. ENT: No sore throat. Respiratory: Denies cough Cardiovascular: Denies chest pain Gastrointestinal: Positive abdominal pain Genitourinary: Negative for dysuria. Musculoskeletal: Negative for back pain. Skin: Negative for rash. Psychiatric: no mood changes,     ____________________________________________   PHYSICAL EXAM:  VITAL SIGNS: ED Triage Vitals  Enc Vitals Group     BP 05/28/21 1153 (!) 157/88     Pulse Rate 05/28/21 1153 71     Resp 05/28/21 1153 18     Temp 05/28/21 1153 98.3 F (36.8 C)     Temp Source 05/28/21 1153 Oral     SpO2 05/28/21 1153 98 %     Weight 05/28/21 1154 174 lb (78.9 kg)     Height 05/28/21 1154 5\' 1"  (1.549 m)     Head Circumference --      Peak Flow --      Pain Score 05/28/21 1153 7     Pain Loc --      Pain Edu? --      Excl. in GC? --     Constitutional: Alert and oriented. Well appearing and in no  acute distress. Eyes: Conjunctivae are normal.  Head: Atraumatic. Nose: No congestion/rhinnorhea. Mouth/Throat: Mucous membranes are moist.   Neck:  supple no lymphadenopathy noted Cardiovascular: Normal rate, regular rhythm. Heart sounds are normal Respiratory: Normal respiratory effort.  No retractions, lungs c t a  Abd: soft tender in the right lower quadrant bs normal all 4 quad GU: deferred Musculoskeletal: FROM all extremities, warm and well perfused Neurologic:  Normal speech and language.  Skin:  Skin is warm, dry and intact. No rash noted. Psychiatric: Mood and affect are normal. Speech and behavior are normal.  ____________________________________________   LABS (all labs ordered are listed, but only abnormal results are  displayed)  Labs Reviewed  LIPASE, BLOOD - Abnormal; Notable for the following components:      Result Value   Lipase 53 (*)    All other components within normal limits  COMPREHENSIVE METABOLIC PANEL - Abnormal; Notable for the following components:   CO2 21 (*)    Glucose, Bld 127 (*)    All other components within normal limits  URINALYSIS, COMPLETE (UACMP) WITH MICROSCOPIC - Abnormal; Notable for the following components:   Color, Urine YELLOW (*)    APPearance HAZY (*)    Leukocytes,Ua SMALL (*)    All other components within normal limits  CBC  POC URINE PREG, ED   ____________________________________________   ____________________________________________  RADIOLOGY  CT renal stone  ____________________________________________   PROCEDURES  Procedure(s) performed: No  Procedures    ____________________________________________   INITIAL IMPRESSION / ASSESSMENT AND PLAN / ED COURSE  Pertinent labs & imaging results that were available during my care of the patient were reviewed by me and considered in my medical decision making (see chart for details).   The patient is a 45 year old female presents emergency department with right lower quadrant pain.  See HPI.  Physical exam shows patient appears stable  DDx: Acute cholecystitis, acute appendicitis, ovarian cyst  CBC is normal, patient's metabolic panel is normal except for glucose is elevated at 127, lipase is minimally elevated 53, POC pregnancy is negative, urinalysis shows small amount of leuks  CT renal stone study  CT reviewed by me confirmed by radiology to have gallstones, normal appendix  Due to the gallstones and concerns for acute cholecystitis I did order a right upper quadrant ultrasound.  All information has been conveyed to the patient.  She is aware of the pending ultrasound.  She is to stay n.p.o.  Care is being transferred to [facility], NP  Bindu ELYCIA WOODSIDE was evaluated  in Emergency Department on 05/28/2021 for the symptoms described in the history of present illness. She was evaluated in the context of the global COVID-19 pandemic, which necessitated consideration that the patient might be at risk for infection with the SARS-CoV-2 virus that causes COVID-19. Institutional protocols and algorithms that pertain to the evaluation of patients at risk for COVID-19 are in a state of rapid change based on information released by regulatory bodies including the CDC and federal and state organizations. These policies and algorithms were followed during the patient's care in the ED.    As part of my medical decision making, I reviewed the following data within the electronic MEDICAL RECORD NUMBER Nursing notes reviewed and incorporated, Labs reviewed , Old chart reviewed, Patient signed out to [facility], Radiograph reviewed , Notes from prior ED visits, and [place] Controlled Substance Database  ____________________________________________   FINAL CLINICAL IMPRESSION(S) / ED DIAGNOSES  Final diagnoses:  RUQ pain  NEW MEDICATIONS STARTED DURING THIS VISIT:  New Prescriptions   No medications on file     Note:  This document was prepared using Dragon voice recognition software and may include unintentional dictation errors.    Faythe Ghee, PA-C 05/28/21 1624    Sharyn Creamer, MD 05/28/21 2116

## 2021-05-28 NOTE — ED Provider Notes (Signed)
  Physical Exam  BP 124/72 (BP Location: Left Arm)   Pulse (!) 59   Temp 98.1 F (36.7 C) (Oral)   Resp 18   Ht 5\' 1"  (1.549 m)   Wt 78.9 kg   SpO2 97%   BMI 32.88 kg/m   Physical Exam  ED Course/Procedures     Procedures  MDM   45 year old presenting to the emergency department for right upper quadrant abdominal pain that started this morning.  See previous provider chart and exam for further details.   CT shows cholelithiasis.  Awaiting ultrasound to rule out acute cholecystitis.  Ultrasound shows cholelithiasis without evidence of cholecystitis.  Patient denies pain at this time.  She will be discharged home with a surgical referral.  She is return to the emergency department for symptoms of change or worsen if unable to schedule an appointment with primary care or the surgeon.  She will be prescribed Levsin for symptomatic relief.      57, FNP 05/28/21 1817    05/30/21, MD 05/28/21 [id number]

## 2021-06-10 ENCOUNTER — Ambulatory Visit: Payer: Self-pay | Admitting: Surgery

## 2021-06-10 ENCOUNTER — Encounter: Payer: Self-pay | Admitting: Surgery

## 2021-06-10 ENCOUNTER — Other Ambulatory Visit: Payer: Self-pay

## 2021-06-10 ENCOUNTER — Ambulatory Visit (INDEPENDENT_AMBULATORY_CARE_PROVIDER_SITE_OTHER): Payer: Self-pay | Admitting: Surgery

## 2021-06-10 VITALS — BP 129/86 | HR 92 | Temp 98.8°F | Ht 61.0 in | Wt 172.4 lb

## 2021-06-10 DIAGNOSIS — K8010 Calculus of gallbladder with chronic cholecystitis without obstruction: Secondary | ICD-10-CM

## 2021-06-10 NOTE — Progress Notes (Signed)
Patient ID: Adrienne Wood, female   DOB: 1976/01/28, 45 y.o.   MRN: [id number]  Chief Complaint: Gallstones  History of Present Illness Adrienne Wood is a 45 y.o. female with recent ultrasound from August 31 confirming the presence of gallstones.  She reports a right sided/subcostal pain which followed a meal of [place] food, this prompted her visit to the ER.  She had associated nausea at that time.  She denies any pain currently, denying any nausea vomiting fevers or chills.  She reports no remarkable changes in her bowel movements, no significant changes in color, no history of jaundice, no history of dysuria, melena.  Past Medical History Past Medical History:  Diagnosis Date   Abnormal Pap smear       Past Surgical History:  Procedure Laterality Date   CLEFT LIP REPAIR     LEEP      No Known Allergies  Current Outpatient Medications  Medication Sig Dispense Refill   acetaminophen (TYLENOL) 325 MG tablet Take by mouth.     hydrOXYzine (ATARAX/VISTARIL) 10 MG tablet Take 1 tablet (10 mg total) by mouth every 8 (eight) hours as needed for anxiety. 30 tablet 0   Hyoscyamine Sulfate SL (LEVSIN/SL) 0.125 MG SUBL Place 0.125 mg under the tongue 4 (four) times daily as needed. 60 tablet 0   Multiple Vitamin (MULTIVITAMIN) tablet Take 1 tablet by mouth daily.     omeprazole (PRILOSEC) 40 MG capsule Take 1 capsule by mouth every morning.     sertraline (ZOLOFT) 25 MG tablet Take 25 mg by mouth at bedtime.     SUMAtriptan (IMITREX) 50 MG tablet Take 50 mg by mouth 2 (two) times daily as needed.     No current facility-administered medications for this visit.    Family History Family History  Problem Relation Age of Onset   Hypertension Mother    Plantar fasciitis Mother       Social History Social History   Tobacco Use   Smoking status: Never   Smokeless tobacco: Never  Substance Use Topics   Alcohol use: Yes    Comment: occasional   Drug use: No         Review of Systems  Constitutional:  Positive for malaise/fatigue.  HENT:  Positive for hearing loss.   Eyes: Negative.   Respiratory: Negative.    Cardiovascular:  Positive for palpitations.  Gastrointestinal:  Positive for abdominal pain and heartburn.  Genitourinary: Negative.   Skin: Negative.   Neurological:  Positive for tingling and headaches.  Psychiatric/Behavioral:  The patient is nervous/anxious.      Physical Exam Blood pressure 129/86, pulse 92, temperature 98.8 F (37.1 C), temperature source Oral, height 5\' 1"  (1.549 m), weight 172 lb 6.4 oz (78.2 kg), SpO2 95 %. Last Weight  Most recent update: 06/10/2021  2:05 PM    Weight  78.2 kg (172 lb 6.4 oz)             CONSTITUTIONAL: Well developed, and nourished, appropriately responsive and aware without distress.   EYES: Sclera non-icteric.   EARS, NOSE, MOUTH AND THROAT: Mask worn.   Hearing is intact to voice.  NECK: Trachea is midline, and there is no jugular venous distension.  LYMPH NODES:  Lymph nodes in the neck are not enlarged. RESPIRATORY:  Lungs are clear, and breath sounds are equal bilaterally. Normal respiratory effort without pathologic use of accessory muscles. CARDIOVASCULAR: Heart is regular in rate and rhythm. GI: The abdomen is soft, nontender, and nondistended.  There were no palpable masses. I did not appreciate hepatosplenomegaly. There were normal bowel sounds. MUSCULOSKELETAL:  Symmetrical muscle tone appreciated in all four extremities.    SKIN: Skin turgor is normal. No pathologic skin lesions appreciated.  NEUROLOGIC:  Motor and sensation appear grossly normal.  Cranial nerves are grossly without defect. PSYCH:  Alert and oriented to person, place and time. Affect is appropriate for situation.  Data Reviewed I have personally reviewed what is currently available of the patient's imaging, recent labs and medical records.   Labs:  CBC Latest Ref Rng & Units 05/28/2021 05/10/2021  WBC 4.0  - 10.5 K/uL 10.0 11.8(H)  Hemoglobin 12.0 - 15.0 g/dL 71.6 96.7  Hematocrit 89.3 - 46.0 % 40.4 39.9  Platelets 150 - 400 K/uL 332 349   CMP Latest Ref Rng & Units 05/28/2021 05/10/2021  Glucose 70 - 99 mg/dL 810(F) 751(W)  BUN 6 - 20 mg/dL 10 11  Creatinine 2.58 - 1.00 mg/dL 5.27 7.82  Sodium 423 - 145 mmol/L 136 138  Potassium 3.5 - 5.1 mmol/L 4.0 3.6  Chloride 98 - 111 mmol/L 107 106  CO2 22 - 32 mmol/L 21(L) 22  Calcium 8.9 - 10.3 mg/dL 9.3 9.4  Total Protein 6.5 - 8.1 g/dL 8.1 8.0  Total Bilirubin 0.3 - 1.2 mg/dL 0.8 1.1  Alkaline Phos 38 - 126 U/L 64 63  AST 15 - 41 U/L 16 19  ALT 0 - 44 U/L 12 14      Imaging: Radiology review:  I have personally reviewed the images with the associated report. EXAM: ULTRASOUND ABDOMEN LIMITED RIGHT UPPER QUADRANT   COMPARISON:  None.   FINDINGS: Gallbladder:   A 2.3 cm shadowing echogenic gallstone is seen lodged within the neck of the gallbladder. The gallbladder wall measures 3.7 mm in thickness. No sonographic Murphy sign noted by sonographer.   Common bile duct:   Diameter: 4.3 mm   Liver:   No focal lesion identified. Within normal limits in parenchymal echogenicity. Portal vein is patent on color Doppler imaging with normal direction of blood flow towards the liver.   Other: None.   IMPRESSION: Cholelithiasis without acute cholecystitis.     Electronically Signed   By: Aram Candela M.D.   On: 05/28/2021 17:12    Within last 24 hrs: No results found.  Assessment    Chronic calculus cholecystitis. 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   This was discussed thoroughly.  Optimal plan is for robotic cholecystectomy.  Risks and benefits have been discussed with the patient which include but are not limited to anesthesia, bleeding, infection, biliary  ductal injury or stenosis, other associated unanticipated injuries affiliated with laparoscopic surgery.  I believe there is the desire to proceed, interpreter utilized as needed.  Questions elicited and answered to satisfaction.  No guarantees ever expressed or implied.   Face-to-face time spent with the patient and accompanying care providers(if present) was 30 minutes, with more than 50% of the time spent counseling, educating, and coordinating care of the patient.    These notes generated with voice recognition software. I apologize for typographical errors.  Campbell Lerner M.D., FACS 06/10/2021, 3:20 PM

## 2021-06-10 NOTE — Patient Instructions (Addendum)
Our surgery scheduler Barbara will call you within 24-48 hours to get you scheduled. If you have not heard from her after 48 hours, please call our office. You will not need to get Covid tested before surgery and have the blue sheet available when she calls to write down important information.   If you have any concerns or questions, please feel free to call our office.    Minimally Invasive Cholecystectomy Minimally invasive cholecystectomy is surgery to remove the gallbladder. The gallbladder is a pear-shaped organ that lies beneath the liver on the right side of the body. The gallbladder stores bile, which is a fluid that helps the body digest fats. Cholecystectomy is often done to treat inflammation of the gallbladder (cholecystitis). This condition is usually caused by a buildup of gallstones (cholelithiasis) in the gallbladder. Gallstones can block the flow of bile, which can resultin inflammation and pain. In severe cases, emergency surgery may be required. This procedure is done though small incisions in the abdomen, instead of one large incision. It is also called laparoscopic surgery. A thin scope with a camera (laparoscope) is inserted through one incision. Then surgical instruments are inserted through the other incisions. In some cases, a minimally invasive surgery may need to be changed to a surgery that is done through a larger incision. This iscalled open surgery. Tell a health care provider about: Any allergies you have. All medicines you are taking, including vitamins, herbs, eye drops, creams, and over-the-counter medicines. Any problems you or family members have had with anesthetic medicines. Any blood disorders you have. Any surgeries you have had. Any medical conditions you have. Whether you are pregnant or may be pregnant. What are the risks? Generally, this is a safe procedure. However, problems may occur, including: Infection. Bleeding. Allergic reactions to  medicines. Damage to nearby structures or organs. A stone remaining in the common bile duct. The common bile duct carries bile from the gallbladder into the small intestine. A bile leak from the cyst duct that is clipped when your gallbladder is removed. What happens before the procedure? Staying hydrated Follow instructions from your health care provider about hydration, which may include: Up to 2 hours before the procedure - you may continue to drink clear liquids, such as water, clear fruit juice, black coffee, and plain tea.  Eating and drinking restrictions Follow instructions from your health care provider about eating and drinking, which may include: 8 hours before the procedure - stop eating heavy meals or foods, such as meat, fried foods, or fatty foods. 6 hours before the procedure - stop eating light meals or foods, such as toast or cereal. 6 hours before the procedure - stop drinking milk or drinks that contain milk. 2 hours before the procedure - stop drinking clear liquids. Medicines Ask your health care provider about: Changing or stopping your regular medicines. This is especially important if you are taking diabetes medicines or blood thinners. Taking medicines such as aspirin and ibuprofen. These medicines can thin your blood. Do not take these medicines unless your health care provider tells you to take them. Taking over-the-counter medicines, vitamins, herbs, and supplements. General instructions Let your health care provider know if you develop a cold or an infection before surgery. Plan to have someone take you home from the hospital or clinic. If you will be going home right after the procedure, plan to have someone with you for 24 hours. Ask your health care provider: How your surgery site will be marked. What steps   will be taken to help prevent infection. These may include: Removing hair at the surgery site. Washing skin with a germ-killing soap. Taking  antibiotic medicine. What happens during the procedure?  An IV will be inserted into one of your veins. You will be given one or both of the following: A medicine to help you relax (sedative). A medicine to make you fall asleep (general anesthetic). A breathing tube will be placed in your mouth. Your surgeon will make several small incisions in your abdomen. The laparoscope will be inserted through one of the small incisions. The camera on the laparoscope will send images to a monitor in the operating room. This lets your surgeon see inside your abdomen. A gas will be pumped into your abdomen. This will expand your abdomen to give the surgeon more room to perform the surgery. Other tools that are needed for the procedure will be inserted through the other incisions. The gallbladder will be removed through one of the incisions. Your common bile duct may be examined. If stones are found in the common bile duct, they may be removed. After your gallbladder has been removed, the incisions will be closed with stitches (sutures), staples, or skin glue. Your incisions may be covered with a bandage (dressing). The procedure may vary among health care providers and hospitals. What happens after the procedure? Your blood pressure, heart rate, breathing rate, and blood oxygen level will be monitored until you leave the hospital or clinic. You will be given medicines as needed to control your pain. If you were given a sedative during the procedure, it can affect you for several hours. Do not drive or operate machinery until your health care provider says that it is safe. Summary Minimally invasive cholecystectomy, also called laparoscopic cholecystectomy, is surgery to remove the gallbladder using small incisions. Tell your health care provider about all the medical conditions you have and all the medicines you are taking for those conditions. Before the procedure, follow instructions about eating or  drinking restrictions and changing or stopping medicines. If you were given a sedative during the procedure, it can affect you for several hours. Do not drive or operate machinery until your health care provider says that it is safe. This information is not intended to replace advice given to you by your health care provider. Make sure you discuss any questions you have with your healthcare provider. Document Revised: 06/19/2019 Document Reviewed: 06/19/2019 Elsevier Patient Education  2022 [employer]  

## 2021-06-12 ENCOUNTER — Telehealth: Payer: Self-pay | Admitting: *Deleted

## 2021-06-12 ENCOUNTER — Inpatient Hospital Stay
Admission: RE | Admit: 2021-06-12 | Discharge: 2021-06-12 | Disposition: A | Discharge status: Home / Self Care | Payer: Medicaid Other | Source: Ambulatory Visit

## 2021-06-12 HISTORY — DX: Anxiety disorder, unspecified: F41.9

## 2021-06-12 HISTORY — DX: Cardiac arrhythmia, unspecified: I49.9

## 2021-06-12 HISTORY — DX: Unspecified hearing loss, unspecified ear: H91.90

## 2021-06-12 NOTE — Telephone Encounter (Signed)
Patient called and would like to get surgery r/s.   She is currently scheduled on 06/18/21 with Dr Claudine Mouton

## 2021-06-12 NOTE — Pre-Procedure Instructions (Signed)
Outgoing call made to patient to complete preop interview.  Left message for her to call back.

## 2021-06-13 NOTE — Telephone Encounter (Signed)
Left message for patient to call me so that we can look at alternative dates for rescheduling her surgery.

## 2021-06-13 NOTE — Patient Instructions (Signed)
Your procedure is scheduled on: 06/18/2021  Report to the Registration Desk on the 1st floor of the [facility]. To find out your arrival time, please call [phone] between 1PM - 3PM on: 06/17/2021  REMEMBER: Instructions that are not followed completely may result in serious medical risk, up to and including death; or upon the discretion of your surgeon and anesthesiologist your surgery may need to be rescheduled.  Do not eat food after midnight the night before surgery.  No gum chewing, lozengers or hard candies.  You may however, drink CLEAR liquids up to 2 hours before you are scheduled to arrive for your surgery. Do not drink anything within 2 hours of your scheduled arrival time.  Clear liquids include: - water  - apple juice without pulp - gatorade (not RED, PURPLE, OR BLUE) - black coffee or tea (Do NOT add milk or creamers to the coffee or tea) Do NOT drink anything that is not on this list.    TAKE THESE MEDICATIONS THE MORNING OF SURGERY WITH A SIP OF WATER: hydrOXYzine  Omeprazole (take one the night before and one on the morning of surgery - helps to prevent nausea after surgery.)  Imitrex                                                                                                                                                                                                                      One week prior to surgery: Stop Anti-inflammatories (NSAIDS) such as Advil, Aleve, Ibuprofen, Motrin, Naproxen, Naprosyn and Aspirin based products such as Excedrin, Goodys Powder, BC Powder. Stop ANY OVER THE COUNTER supplements until after surgery. You may however, continue to take Tylenol if needed for pain up until the day of surgery.  No Alcohol for 24 hours before or after surgery.  No Smoking including e-cigarettes for 24 hours prior to surgery.  No chewable tobacco products for at least 6 hours prior to surgery.  No nicotine patches on the day of surgery.  Do  not use any "recreational" drugs for at least a week prior to your surgery.  Please be advised that the combination of cocaine and anesthesia may have negative outcomes, up to and including death. If you test positive for cocaine, your surgery will be cancelled.  On the morning of surgery brush your teeth with toothpaste and water, you may rinse your mouth with mouthwash if you wish. Do not swallow any toothpaste or mouthwash.  Use CHG Soap or wipes as  directed on instruction sheet.-provided for you   Do not wear jewelry, make-up, hairpins, clips or nail polish.  Do not wear lotions, powders, or perfumes.   Do not shave body from the neck down 48 hours prior to surgery just in case you cut yourself which could leave a site for infection.  Also, freshly shaved skin may become irritated if using the CHG soap.  Contact lenses, hearing aids and dentures may not be worn into surgery.  Do not bring valuables to the hospital. [facility] is not responsible for any missing/lost belongings or valuables.     Notify your doctor if there is any change in your medical condition (cold, fever, infection).  Wear comfortable clothing (specific to your surgery type) to the hospital.  After surgery, you can help prevent lung complications by doing breathing exercises.  Take deep breaths and cough every 1-2 hours. Your doctor may order a device called an Incentive Spirometer to help you take deep breaths. When coughing or sneezing, hold a pillow firmly against your incision with both hands. This is called "splinting." Doing this helps protect your incision. It also decreases belly discomfort.  If you are being admitted to the hospital overnight, leave your suitcase in the car. After surgery it may be brought to your room.  If you are being discharged the day of surgery, you will not be allowed to drive home. You will need a responsible adult (18 years or older) to drive you home and stay with you that  night.   If you are taking public transportation, you will need to have a responsible adult (18 years or older) with you. Please confirm with your physician that it is acceptable to use public transportation.   Please call the Pre-admissions Testing Dept. at [phone] if you have any questions about these instructions.  Surgery Visitation Policy:  Patients undergoing a surgery or procedure may have one family member or support person with them as long as that person is not COVID-19 positive or experiencing its symptoms.  That person may remain in the waiting area during the procedure.  Inpatient Visitation:    Visiting hours are 7 a.m. to 8 p.m. Inpatients will be allowed two visitors daily. The visitors may change each day during the patient's stay. No visitors under the age of 70. Any visitor under the age of 34 must be accompanied by an adult. The visitor must pass COVID-19 screenings, use hand sanitizer when entering and exiting the patient's room and wear a mask at all times, including in the patient's room. Patients must also wear a mask when staff or their visitor are in the room. Masking is required regardless of vaccination status.

## 2021-06-13 NOTE — Pre-Procedure Instructions (Signed)
Unable to reach patient via phone call. Left messages X 2 . Pre-admission testing interview will be re-schedule for Monday if patient does not return a call today , 06/13/2021.

## 2021-06-16 ENCOUNTER — Other Ambulatory Visit: Admission: RE | Admit: 2021-06-16 | Payer: Medicaid Other | Source: Ambulatory Visit

## 2021-06-16 NOTE — Telephone Encounter (Signed)
Through patient's mychart message, surgery is cancelled for 06/18/21 at patient's request.  Patient will call back at a later date when ready to schedule.

## 2021-06-18 ENCOUNTER — Ambulatory Visit: Admission: RE | Admit: 2021-06-18 | Payer: Medicaid Other | Source: Home / Self Care | Admitting: Surgery

## 2021-06-18 ENCOUNTER — Encounter: Admission: RE | Payer: Self-pay | Source: Home / Self Care

## 2021-06-18 SURGERY — CHOLECYSTECTOMY, ROBOT-ASSISTED, LAPAROSCOPIC
Anesthesia: General

## 2022-01-07 DIAGNOSIS — R519 Headache, unspecified: Secondary | ICD-10-CM | POA: Insufficient documentation

## 2022-01-07 NOTE — ED Triage Notes (Signed)
Pt reports a headache for a week and a half. Pt denies any other symptoms at this time.  ?

## 2022-01-08 ENCOUNTER — Emergency Department
Admission: EM | Admit: 2022-01-08 | Discharge: 2022-01-08 | Disposition: A | Discharge status: Home / Self Care | Payer: Medicaid Other | Attending: Emergency Medicine | Admitting: Emergency Medicine

## 2022-01-08 DIAGNOSIS — R519 Headache, unspecified: Secondary | ICD-10-CM

## 2022-01-08 MED ORDER — BUTALBITAL-APAP-CAFFEINE 50-325-40 MG PO TABS: 1.0000 | ORAL_TABLET | Freq: Four times a day (QID) | ORAL | 0 refills | Status: DC | PRN

## 2022-01-08 MED ORDER — BUTALBITAL-APAP-CAFFEINE 50-325-40 MG PO TABS
1.0000 | ORAL_TABLET | Freq: Once | ORAL | Status: AC
  Administered 2022-01-08: 1 via ORAL
  Filled 2022-01-08: qty 1

## 2022-01-08 NOTE — Discharge Instructions (Addendum)
Please use phenylephrine by mouth 10 mg every 4 hours as needed.  If this improves your symptoms over the next 24 hours.  Please take Zyrtec (cetirizine) over-the-counter daily.  If this medication does not work, please use the prescribed Fioricet and follow-up with your migraine physician ?

## 2022-01-08 NOTE — ED Provider Notes (Signed)
? ?[facility] ?Provider Note ? ? Event Date/Time  ? First MD Initiated Contact with Patient 01/08/22 0016   ?  (approximate) ?History  ?Headache ? ?HPI ?Adrienne Wood is a 46 y.o. female with a stated past medical history of migraines who presents for frontal headache that is been present over the last week.  Patient states that it begins in the morning rather benign however worsens throughout the day.  Patient points to above bilateral eyebrows when asked where this headache is.  Patient denies any vision changes, weakness/numbness/paresthesias in any extremity, difficulty speaking, facial droop, nausea/vomiting, photophobia, phonophobia, or nonadherence to any of her migraine medicines. ?Physical Exam  ?Triage Vital Signs: ?ED Triage Vitals  ?Enc Vitals Group  ?   BP 01/07/22 2043 (!) 150/98  ?   Pulse Rate 01/07/22 2043 79  ?   Resp 01/07/22 2043 17  ?   Temp 01/07/22 2043 98.1 ?F (36.7 ?C)  ?   Temp src --   ?   SpO2 01/07/22 2043 97 %  ?   Weight 01/07/22 2043 178 lb (80.7 kg)  ?   Height 01/07/22 2043 5\' 1"  (1.549 m)  ?   Head Circumference --   ?   Peak Flow --   ?   Pain Score 01/07/22 2043 5  ?   Pain Loc --   ?   Pain Edu? --   ?   Excl. in Ward? --   ? ?Most recent vital signs: ?Vitals:  ? 01/07/22 2043  ?BP: (!) 150/98  ?Pulse: 79  ?Resp: 17  ?Temp: 98.1 ?F (36.7 ?C)  ?SpO2: 97%  ? ?General: Awake, oriented x4. ?CV:  Good peripheral perfusion.  ?Resp:  Normal effort.  ?Abd:  No distention.  ?Other:  Middle-aged overweight Caucasian female laying in bed in no distress.  NIHSS 0.  Inflamed nasal turbinates bilaterally ?ED Results / Procedures / Treatments  ? ?PROCEDURES: ?Critical Care performed: No ?Procedures ?MEDICATIONS ORDERED IN ED: ?Medications - No data to display ?IMPRESSION / MDM / ASSESSMENT AND PLAN / ED COURSE  ?I reviewed the triage vital signs and the nursing notes. ?             ?               ? ?46 year old female with a history of migraines presents with  Headache. ? ?No focal neurological symptoms. Neuro exam is benign. Pt is nontoxic. VSS. ? ?Based on history and normal neurological exam I have low suspicion for intracranial tumor, intracranial bleed, meningitis, temporal arteritis, glaucoma, CO poisoning. ?Presentation does not specifically fit, and migraine symptoms.  Given the frontal aspect of these headaches, with associated inflamed nasal turbinates, concern for possible sinus headache. ?Most likely patient has benign headache, recommend rest, hydration, decongestants, and ibuprofen as well as follow-up with her primary care physician ? ?Disposition: Discharge home with strict return precautions and instructions for prompt primary care follow up. ? ?  ?FINAL CLINICAL IMPRESSION(S) / ED DIAGNOSES  ? ?Final diagnoses:  ?Frontal headache  ? ?Rx / DC Orders  ? ?ED Discharge Orders   ? ?      Ordered  ?  butalbital-acetaminophen-caffeine (FIORICET) 50-325-40 MG tablet  Every 6 hours PRN       ? 01/08/22 0032  ? ?  ?  ? ?  ? ?Note:  This document was prepared using Dragon voice recognition software and may include unintentional dictation errors. ?  ?Naaman Plummer,  MD ?01/08/22 0035 ? ?

## 2022-05-10 ENCOUNTER — Other Ambulatory Visit: Payer: Self-pay

## 2022-05-10 ENCOUNTER — Emergency Department (HOSPITAL_BASED_OUTPATIENT_CLINIC_OR_DEPARTMENT_OTHER)
Admission: EM | Admit: 2022-05-10 | Discharge: 2022-05-10 | Disposition: A | Discharge status: Home / Self Care | Payer: Medicaid Other | Attending: Emergency Medicine | Admitting: Emergency Medicine

## 2022-05-10 ENCOUNTER — Emergency Department (HOSPITAL_BASED_OUTPATIENT_CLINIC_OR_DEPARTMENT_OTHER): Payer: Medicaid Other

## 2022-05-10 ENCOUNTER — Encounter (HOSPITAL_BASED_OUTPATIENT_CLINIC_OR_DEPARTMENT_OTHER): Payer: Self-pay

## 2022-05-10 DIAGNOSIS — E876 Hypokalemia: Secondary | ICD-10-CM | POA: Insufficient documentation

## 2022-05-10 DIAGNOSIS — R1011 Right upper quadrant pain: Secondary | ICD-10-CM

## 2022-05-10 DIAGNOSIS — K8020 Calculus of gallbladder without cholecystitis without obstruction: Secondary | ICD-10-CM

## 2022-05-10 DIAGNOSIS — R7309 Other abnormal glucose: Secondary | ICD-10-CM | POA: Insufficient documentation

## 2022-05-10 LAB — COMPREHENSIVE METABOLIC PANEL
ALT: 10 U/L (ref 0–44)
AST: 12 U/L — ABNORMAL LOW (ref 15–41)
Albumin: 5 g/dL (ref 3.5–5.0)
Alkaline Phosphatase: 77 U/L (ref 38–126)
Anion gap: 12 (ref 5–15)
BUN: 10 mg/dL (ref 6–20)
CO2: 24 mmol/L (ref 22–32)
Calcium: 9.8 mg/dL (ref 8.9–10.3)
Chloride: 104 mmol/L (ref 98–111)
Creatinine, Ser: 0.79 mg/dL (ref 0.44–1.00)
GFR, Estimated: >60 mL/min (ref >60)
Glucose, Bld: 118 mg/dL — ABNORMAL HIGH (ref 70–99)
Potassium: 3.3 mmol/L — ABNORMAL LOW (ref 3.5–5.1)
Sodium: 140 mmol/L (ref 135–145)
Total Bilirubin: 0.9 mg/dL (ref 0.3–1.2)
Total Protein: 8.4 g/dL — ABNORMAL HIGH (ref 6.5–8.1)

## 2022-05-10 LAB — CBC
HCT: 41.3 % (ref 36.0–46.0)
Hemoglobin: 14 g/dL (ref 12.0–15.0)
MCH: 28.7 pg (ref 26.0–34.0)
MCHC: 33.9 g/dL (ref 30.0–36.0)
MCV: 84.6 fL (ref 80.0–100.0)
Platelets: 357 10*3/uL (ref 150–400)
RBC: 4.88 MIL/uL (ref 3.87–5.11)
RDW: 12.7 % (ref 11.5–15.5)
WBC: 9.3 10*3/uL (ref 4.0–10.5)
nRBC: 0 % (ref 0.0–0.2)

## 2022-05-10 LAB — LIPASE, BLOOD: Lipase: 34 U/L (ref 11–51)

## 2022-05-10 LAB — URINALYSIS, ROUTINE W REFLEX MICROSCOPIC
Bilirubin Urine: NEGATIVE
Glucose, UA: NEGATIVE mg/dL
Hgb urine dipstick: NEGATIVE
Ketones, ur: NEGATIVE mg/dL
Leukocytes,Ua: NEGATIVE
Nitrite: NEGATIVE
Specific Gravity, Urine: 1.03 (ref 1.005–1.030)
pH: 5.5 (ref 5.0–8.0)

## 2022-05-10 LAB — HCG, SERUM, QUALITATIVE: Preg, Serum: NEGATIVE

## 2022-05-10 MED ORDER — POTASSIUM CHLORIDE CRYS ER 20 MEQ PO TBCR
40.0000 meq | EXTENDED_RELEASE_TABLET | Freq: Once | ORAL | Status: AC
  Administered 2022-05-10: 40 meq via ORAL
  Filled 2022-05-10: qty 2

## 2022-05-10 MED ORDER — LIDOCAINE VISCOUS HCL 2 % MT SOLN
15.0000 mL | Freq: Once | OROMUCOSAL | Status: AC
  Administered 2022-05-10: 15 mL via ORAL
  Filled 2022-05-10: qty 15

## 2022-05-10 MED ORDER — ALUM & MAG HYDROXIDE-SIMETH 200-200-20 MG/5ML PO SUSP
30.0000 mL | Freq: Once | ORAL | Status: AC
  Administered 2022-05-10: 30 mL via ORAL
  Filled 2022-05-10: qty 30

## 2022-05-10 NOTE — ED Provider Notes (Signed)
[facility] EMERGENCY DEPT Provider Note   CSN: [id number] Arrival date & time: 05/10/22  1249     History  Chief Complaint  Patient presents with   Abdominal Pain    Adrienne Wood is a 46 y.o. female with a past medical history significant for known gallstones who has not undergone cholecystectomy who presents with concern for right upper quadrant pain that began this morning, and reports it has been constant since this morning.  She reports some mild nausea, 1 episode of vomiting, but reports that she thinks it was associated more with brushing the back of her tongue.  She reports some loose stools yesterday, no diarrhea, constipation, fever, chills today.  She endorses no previous history of abdomen surgery, endorses previous history of acid reflux.  She reports minimal improvement of her gallbladder pain after taking hyoscamine sublingually prior to arrival.   Abdominal Pain      Home Medications Prior to Admission medications   Medication Sig Start Date End Date Taking? Authorizing Provider  butalbital-acetaminophen-caffeine (FIORICET) 50-325-40 MG tablet Take 1-2 tablets by mouth every 6 (six) hours as needed for headache. 01/08/22 01/08/23  Merwyn Katos, MD  hydrOXYzine (ATARAX/VISTARIL) 10 MG tablet Take 1 tablet (10 mg total) by mouth every 8 (eight) hours as needed for anxiety. Patient not taking: No sig reported 05/10/21   Chesley Noon, MD  hydrOXYzine (ATARAX/VISTARIL) 25 MG tablet Take 25-50 mg by mouth every 6 (six) hours as needed for anxiety. 06/02/21   [provider]  Hyoscyamine Sulfate SL (LEVSIN/SL) 0.125 MG SUBL Place 0.125 mg under the tongue 4 (four) times daily as needed. 05/28/21   Triplett, Cari B, FNP  ibuprofen (ADVIL) 200 MG tablet Take 600-800 mg by mouth every 6 (six) hours as needed for moderate pain or headache.    [provider]  omeprazole (PRILOSEC) 40 MG capsule Take 1 capsule by mouth daily as needed  (heartburn). 10/15/20   [provider]  sertraline (ZOLOFT) 25 MG tablet Take 25 mg by mouth at bedtime. 05/23/21   [provider]  SUMAtriptan (IMITREX) 50 MG tablet Take 50 mg by mouth every 2 (two) hours as needed for migraine. 10/18/20   [provider]      Allergies    Patient has no known allergies.    Review of Systems   Review of Systems  Gastrointestinal:  Positive for abdominal pain.  All other systems reviewed and are negative.   Physical Exam Updated Vital Signs BP 126/73   Pulse 80   Temp 98.3 F (36.8 C) (Oral)   Resp 19   Ht 5\' 1"  (1.549 m)   Wt 80.7 kg   SpO2 97%   BMI 33.62 kg/m  Physical Exam Vitals and nursing note reviewed.  Constitutional:      General: She is not in acute distress.    Appearance: Normal appearance.  HENT:     Head: Normocephalic and atraumatic.  Eyes:     General:        Right eye: No discharge.        Left eye: No discharge.  Cardiovascular:     Rate and Rhythm: Normal rate and regular rhythm.     Heart sounds: No murmur heard.    No friction rub. No gallop.  Pulmonary:     Effort: Pulmonary effort is normal.     Breath sounds: Normal breath sounds.  Abdominal:     General: Bowel sounds are normal.  Palpations: Abdomen is soft.     Comments: Minimal TTP RUQ, no rebound, rigidity, guarding.  No abdominal distention, no tenderness throughout the rest of the abdomen.  Skin:    General: Skin is warm and dry.     Capillary Refill: Capillary refill takes less than 2 seconds.  Neurological:     Mental Status: She is alert and oriented to person, place, and time.  Psychiatric:        Mood and Affect: Mood normal.        Behavior: Behavior normal.     ED Results / Procedures / Treatments   Labs (all labs ordered are listed, but only abnormal results are displayed) Labs Reviewed  COMPREHENSIVE METABOLIC PANEL - Abnormal; Notable for the following components:      Result Value   Potassium 3.3  (*)    Glucose, Bld 118 (*)    Total Protein 8.4 (*)    AST 12 (*)    All other components within normal limits  URINALYSIS, ROUTINE W REFLEX MICROSCOPIC - Abnormal; Notable for the following components:   Protein, ur TRACE (*)    All other components within normal limits  LIPASE, BLOOD  CBC  HCG, SERUM, QUALITATIVE    EKG None  Radiology US Abdomen Limited RUQ (LIVER/GB)  Result Date: 05/10/2022 CLINICAL DATA:  Pain right upper quadrant and epigastrium EXAM: ULTRASOUND ABDOMEN LIMITED RIGHT UPPER QUADRANT COMPARISON:  05/28/2021 FINDINGS: Gallbladder: There is a large 1.8 cm calculus in the neck of the gallbladder without definite demonstrable intraluminal mobility. There is no significant distention of gallbladder. Technologist did not observe any tenderness over the gallbladder. Common bile duct: Diameter: 3 mm. Distal common bile duct is not adequately visualized. Liver: There is increased echogenicity suggesting fatty infiltration. There is 7 mm hyperechoic focus in the left lobe, possibly hemangioma. Portal vein is patent on color Doppler imaging with normal direction of blood flow towards the liver. Other: None. IMPRESSION: Large gallbladder stone. There are no definite imaging signs of acute cholecystitis. There is no significant dilation of visualized proximal common bile duct. Fatty liver.  Possible 7 mm hemangioma is seen in the left lobe. Electronically Signed   By: Ernie Avena M.D.   On: 05/10/2022 18:06    Procedures Procedures    Medications Ordered in ED Medications  alum & mag hydroxide-simeth (MAALOX/MYLANTA) 200-200-20 MG/5ML suspension 30 mL (30 mLs Oral Given 05/10/22 1559)    And  lidocaine (XYLOCAINE) 2 % viscous mouth solution 15 mL (15 mLs Oral Given 05/10/22 1559)  potassium chloride SA (KLOR-CON M) CR tablet 40 mEq (40 mEq Oral Given 05/10/22 1829)    ED Course/ Medical Decision Making/ A&P                           Medical Decision Making Amount  and/or Complexity of Data Reviewed Labs: ordered. Radiology: ordered.  Risk OTC drugs. Prescription drug management.   This patient is a 46 y.o. female who presents to the ED for concern of right upper quadrant pain, this involves an extensive number of treatment options, and is a complaint that carries with it a high risk of complications and morbidity. The emergent differential diagnosis prior to evaluation includes, but is not limited to, cholelithiasis, cholangitis, cholecystitis, pancreatitis, GERD, acid reflux, acute mesenteric ischemia, other intra-abdominal abnormality, pyelonephritis, UTI, versus other   This is not an exhaustive differential.   Past Medical History / Co-morbidities / Social History: previous  history of known gallstones.   Additional history: Chart reviewed. Pertinent results include: Reviewed recent previous evaluations in the emergency department including lab work, imaging.  Physical Exam: Physical exam performed. The pertinent findings include: Patient with minimal tenderness palpation of right upper quadrant, negative Murphy sign today.  Lab Tests: I ordered, and personally interpreted labs.  The pertinent results include: Unremarkable UA, lipase, negative serum pregnancy.  Mild hypokalemia, we will orally replete.  Potassium 3.3 today.  Patient with nonfasting mildly elevated glucose at 118.  CBC unremarkable.   Imaging Studies: I ordered imaging studies including right upper quadrant ultrasound. I independently visualized and interpreted imaging which showed 1.8 cm gallstone at gallbladder neck, with some distal bile duct dilation. I agree with the radiologist interpretation.  Patient with signs of possible impending biliary obstruction, however lab work and evaluation today does not suggest that she is having an acute biliary obstruction, cholecystitis, or cholangitis at this time.   Medications: I ordered medication including GI cocktail for nausea,  stomach pain, K-Chlor for hypokalemia. Reevaluation of the patient after these medicines showed that the patient improved. I have reviewed the patients home medicines and have made adjustments as needed.   Disposition: After consideration of the diagnostic results and the patients response to treatment, I feel that patient's symptoms are consistent with gallbladder pain secondary to her gallstone, discussed that she needs to be seen by a surgeon to discuss removal of her gallbladder at this time.   emergency department workup does not suggest an emergent condition requiring admission or immediate intervention beyond what has been performed at this time. The plan is: Discharge with surgery follow-up. The patient is safe for discharge and has been instructed to return immediately for worsening symptoms, change in symptoms or any other concerns.  I discussed this case with my attending physician Dr. Rosalia Hammers who cosigned this note including patient's presenting symptoms, physical exam, and planned diagnostics and interventions. Attending physician stated agreement with plan or made changes to plan which were implemented.    Final Clinical Impression(s) / ED Diagnoses Final diagnoses:  Calculus of gallbladder without cholecystitis without obstruction  RUQ pain    Rx / DC Orders ED Discharge Orders     None         Olene Floss, PA-C 05/10/22 1842    Margarita Grizzle, MD 05/12/22 1225

## 2022-05-10 NOTE — Discharge Instructions (Signed)
Please use Tylenol or ibuprofen for pain.  You may use 600 mg ibuprofen every 6 hours or 1000 mg of Tylenol every 6 hours.  You may choose to alternate between the 2.  This would be most effective.  Not to exceed 4 g of Tylenol within 24 hours.  Not to exceed 3200 mg ibuprofen 24 hours.  Continue using your Hyoscamine sublingually.

## 2022-05-10 NOTE — ED Triage Notes (Signed)
Patient here POV from Home.  Endorses RUQ Pain that began this AM. Constant since this AM.   History of Gallstones. Mild Nausea and 1 Episode of Emesis. Some Loose Stools Yesterday. No Known Fevers.No Dysuria.   NAD Noted during Triage. A&Ox4. GCS 15. Ambulatory.

## 2022-05-12 ENCOUNTER — Other Ambulatory Visit: Payer: Self-pay

## 2022-05-12 ENCOUNTER — Encounter (HOSPITAL_BASED_OUTPATIENT_CLINIC_OR_DEPARTMENT_OTHER): Payer: Self-pay | Admitting: Emergency Medicine

## 2022-05-12 DIAGNOSIS — R519 Headache, unspecified: Secondary | ICD-10-CM | POA: Insufficient documentation

## 2022-05-12 DIAGNOSIS — R202 Paresthesia of skin: Secondary | ICD-10-CM | POA: Insufficient documentation

## 2022-05-12 NOTE — ED Triage Notes (Signed)
Patient reports numbness to right side of face. Started about 4 : 30 AM. Also reports metallic taste that started 1 week ago. Reports history of headache/migraines for past year.

## 2022-05-13 ENCOUNTER — Emergency Department (HOSPITAL_BASED_OUTPATIENT_CLINIC_OR_DEPARTMENT_OTHER)
Admission: EM | Admit: 2022-05-13 | Discharge: 2022-05-13 | Disposition: A | Discharge status: Home / Self Care | Payer: Medicaid Other | Attending: Emergency Medicine | Admitting: Emergency Medicine

## 2022-05-13 DIAGNOSIS — R202 Paresthesia of skin: Secondary | ICD-10-CM

## 2022-05-13 NOTE — ED Provider Notes (Signed)
[facility] EMERGENCY DEPT Provider Note  CSN: [id number] Arrival date & time: 05/12/22 2347  Chief Complaint(s) facial numbness  HPI Adrienne Wood is a 46 y.o. female with a past medical history listed below including anxiety and recurrent migraine headaches presents to the emergency department for right-sided head numbness.  This began around 4:30 AM.  She reports that it was not face or scalp numbness.  She states that a weird sensation inside her head.  She endorses mild headache as well.  Her symptoms resolved upon arriving.  She denies any associated visual changes, facial droop, slurred speech, focal weakness or loss of sensation.  No gait changes.  No recent fevers or infections.  Patient did endorse having metallic taste in her mouth for 1 week.  No other physical complaints  The history is provided by the patient.    Past Medical History Past Medical History:  Diagnosis Date   Abnormal Pap smear    Anxiety    Dysrhythmia    c/o palpitations (? 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 Authorizing Provider  butalbital-acetaminophen-caffeine (FIORICET) 50-325-40 MG tablet Take 1-2 tablets by mouth every 6 (six) hours as needed for headache. 01/08/22 01/08/23  Merwyn Katos, MD  hydrOXYzine (ATARAX/VISTARIL) 10 MG tablet Take 1 tablet (10 mg total) by mouth every 8 (eight) hours as needed for anxiety. Patient not taking: No sig reported 05/10/21   Chesley Noon, MD  hydrOXYzine (ATARAX/VISTARIL) 25 MG tablet Take 25-50 mg by mouth every 6 (six) hours as needed for anxiety. 06/02/21   [provider]  Hyoscyamine Sulfate SL (LEVSIN/SL) 0.125 MG SUBL Place 0.125  mg under the tongue 4 (four) times daily as needed. 05/28/21   Triplett, Cari B, FNP  ibuprofen (ADVIL) 200 MG tablet Take 600-800 mg by mouth every 6 (six) hours as needed for moderate pain or headache.    [provider]  omeprazole (PRILOSEC) 40 MG capsule Take 1 capsule by mouth daily as needed (heartburn). 10/15/20   [provider]  sertraline (ZOLOFT) 25 MG tablet Take 25 mg by mouth at bedtime. 05/23/21   [provider]  SUMAtriptan (IMITREX) 50 MG tablet Take 50 mg by mouth every 2 (two) hours as needed for migraine. 10/18/20   [provider]                                                                                                                                    Allergies Patient has no known allergies.  Review of Systems Review of Systems As noted in HPI  Physical Exam Vital Signs  I have reviewed  the triage vital signs BP (!) 142/87 (BP Location: Right Arm)   Pulse 74   Temp 98.7 F (37.1 C) (Oral)   Resp 16   LMP 05/11/2022   SpO2 97%   Physical Exam Vitals reviewed.  Constitutional:      General: She is not in acute distress.    Appearance: She is well-developed. She is not diaphoretic.  HENT:     Head: Normocephalic and atraumatic.      Right Ear: External ear normal.     Left Ear: External ear normal.     Nose: Nose normal.  Eyes:     General: No scleral icterus.    Conjunctiva/sclera: Conjunctivae normal.  Neck:     Trachea: Phonation normal.  Cardiovascular:     Rate and Rhythm: Normal rate and regular rhythm.  Pulmonary:     Effort: Pulmonary effort is normal. No respiratory distress.     Breath sounds: No stridor.  Abdominal:     General: There is no distension.  Musculoskeletal:        General: Normal range of motion.     Cervical back: Normal range of motion.     Thoracic back: Spasms and tenderness present.       Back:  Neurological:     Mental Status: She is alert and oriented to person, place, and  time.     Comments: Mental Status:  Alert and oriented to person, place, and time.  Attention and concentration normal.  Speech clear.  Recent memory is intact  Cranial Nerves:  II Visual Fields: Intact to confrontation. Visual fields intact. III, IV, VI: Pupils equal and reactive to light and near. Full eye movement without nystagmus  V Facial Sensation: Normal. No weakness of masticatory muscles  VII: No facial weakness or asymmetry  VIII Auditory Acuity: Grossly normal  IX/X: The uvula is midline; the palate elevates symmetrically  XI: Normal sternocleidomastoid and trapezius strength  XII: The tongue is midline. No atrophy or fasciculations.   Motor System: Muscle Strength: 5/5 and symmetric in the upper and lower extremities. No pronation or drift.  Muscle Tone: Tone and muscle bulk are normal in the upper and lower extremities.  Coordination: Intact finger-to-nose. No tremor.  Sensation: Intact to light touch  Gait: Routine gait normal.   Psychiatric:        Behavior: Behavior normal.     ED Results and Treatments Labs (all labs ordered are listed, but only abnormal results are displayed) Labs Reviewed - No data to display                                                                                                                       EKG  EKG Interpretation  Date/Time:    Ventricular Rate:    PR Interval:    QRS Duration:   QT Interval:    QTC Calculation:   R Axis:     Text Interpretation:         Radiology  No results found.  Medications Ordered in ED Medications - No data to display                                                                                                                                   Procedures Procedures  (including critical care time)  Medical Decision Making / ED Course   Medical Decision Making   Right-sided headache with paresthesias. Non focal neuro exam. No recent head trauma. No fever. Doubt meningitis.  Doubt intracranial bleed. Doubt IIH. No indication for imaging.  Symptoms have resolved. Patient is concerned that her headache might be related to her gallstone.  She denies any abdominal pain. Patient is already been referred to neurology for recurrent headaches. Additionally she is already being referred to surgery for her gallstones.  At this time I do not feel any labs or imaging. Patient recurrent headache may be related to upper back and neck muscle.  Recommended supportive management.     Final Clinical Impression(s) / ED Diagnoses Final diagnoses:  Paresthesia   The patient appears reasonably screened and/or stabilized for discharge and I doubt any other medical condition or other [facility] requiring further screening, evaluation, or treatment in the ED at this time. I have discussed the findings, Dx and Tx plan with the patient/family who expressed understanding and agree(s) with the plan. Discharge instructions discussed at length. The patient/family was given strict return precautions who verbalized understanding of the instructions. No further questions at time of discharge.  Disposition: Discharge  Condition: Good  ED Discharge Orders     None         Follow Up: Hamrick, Durward Fortes, MD [address] [place] [address] [phone]  Call  to schedule an appointment for close follow up           This chart was dictated using voice recognition software.  Despite best efforts to proofread,  errors can occur which can change the documentation meaning.    Nira Conn, MD 05/13/22 0120

## 2022-05-13 NOTE — Discharge Instructions (Addendum)
You may use over-the-counter Motrin (Ibuprofen), Acetaminophen (Tylenol), topical muscle creams such as SalonPas, Icy Hot, Bengay, etc. Please stretch, apply ice or heat (whichever helps), and have massage therapy for additional assistance.  

## 2022-05-20 ENCOUNTER — Encounter: Payer: Self-pay | Admitting: Neurology

## 2022-07-28 NOTE — Progress Notes (Unsigned)
Initial neurology clinic note  Adrienne Wood MRN: [id number] DOB: 09-Jul-1976  Referring provider: Ailene Ravel, MD  Primary care provider: Ailene Ravel, MD  Reason for consult:  headache  Subjective:  This is [name], a 46 y.o. right-handed female with a medical history of GERD, generalized anxiety, migraine, insomnia, tachycardia who presents to neurology clinic with headache and dizzy feeling. The patient is accompanied by mother.  Patient feels like she is being "tossed under the ocean" or like she "has been on a carnival ride". She has associated pressure and tenderness, mostly above the eyebrows. She denies associated photophobia, phonophobia, nausea, or vomiting.  She was given fioricet which made her feel bad. She takes sumatriptan occasionally, but this does not seem to help with her symptoms. Patient also endorses constant metallic taste in her mouth. She also has tinnitus in her left ear.  She was seen by ENT, Dr. Juel Wood at [facility]. Per office note from 07/27/22: Procedure Sinonasal Endoscopy (CPT 31231): 07/06/22  Findings: Remnant adenoidal pad. Bilateral patent Eustachian tube orifices. Healthy, symmetric nasopharyngeal and pharyngeal mucosa with no masses, lesions, or friable mucosa. Symmetric base of tongue with clear vallecula bilaterally. No lingual tonsil hypertrophy.  Inspection of the larynx reveals no evidence of supraglottic or glottic masses. Epiglottis crisp. There is bilateral true vocal cord full range of motion with glottic competence. The piriform sinuses are clear bilaterally. No pooling of secretions in the piriform sinuses.   Dns right 40% access to right mm is tight. Dry mucus in right nasal cavity.   Audiogram None  Imaging: Ct sinus 07/13/22 ME and mastoid appears clear - although has appearance of surgery. Recalls polyp removed from left ear age 57  Impression  Mild to moderate mucosal thickening along the floors of the  bilateral maxillary sinuses. The remaining sinuses are clear.   Remote fractures of the left orbital floor and left lamina papyracea.   Assessment/Recommendations:  The patient is a 46 y.o. female with a history of has a past medical history of GERD (gastroesophageal reflux disease). who presents for the evaluation of dygeusia and sinusitis.   Still with metallic taste in mouth. Endoscospy and ct sinus no masses note. wondering about B12 levels will check burning mouth panel.   Discussed nasal hygiene with saline rinses and avoiding decongestants such as Sudafed. Ct sinus inferior maxillary sinus, omu appears patent. No abx recommended based on CT findings. Pt is interested in RAST.   Recommended formal audio evaluation given history of TM damage, tinnitus, and diminished hearing. Will reschedule the audio. She will f/u with audio  Pt does not recall facial fractures or trauma despite ct findings of remote fx of left orbit.   Patient has a history of migraines, probably going back 10 years. She describes these as a throbbing pain on the left side of head that can last a couple of days. She denies photophobia, phonophobia, nausea, vomiting. She last had a headache about 1 month ago. She has not had many of recent since the other dizzy like episodes started.  Past antihistamines/decongestants:  Flonase, allegra, sudafed (helped in the past)  Current triptans:  Sumatriptan Current Antihistamines/Decongestants:  None Hormone/birth control:  None  Caffeine:  rare Alcohol:  occasionally Smoker:  no Diet:  not healthy Exercise:  no Depression:  yes; Anxiety:  yes Other pain:  neck and shoulder pain Sleep hygiene:  has to take Ambien to sleep, sleeps 8-9 hours Family history of headache:  No  MEDICATIONS:  Outpatient Encounter Medications as of 07/29/2022  Medication Sig   hydrOXYzine (ATARAX/VISTARIL) 25 MG tablet Take 25-50 mg by mouth every 6 (six) hours as needed for anxiety.    omeprazole (PRILOSEC) 40 MG capsule Take 1 capsule by mouth daily as needed (heartburn).   sertraline (ZOLOFT) 25 MG tablet Take 50 mg by mouth at bedtime.   SUMAtriptan (IMITREX) 50 MG tablet Take 50 mg by mouth every 2 (two) hours as needed for migraine.   zolpidem (AMBIEN) 10 MG tablet Take 10 mg by mouth at bedtime as needed for sleep. 1/2 tab at nightly   hydrOXYzine (ATARAX/VISTARIL) 10 MG tablet Take 1 tablet (10 mg total) by mouth every 8 (eight) hours as needed for anxiety. (Patient not taking: Reported on 06/10/2021)   Hyoscyamine Sulfate SL (LEVSIN/SL) 0.125 MG SUBL Place 0.125 mg under the tongue 4 (four) times daily as needed. (Patient not taking: Reported on 07/29/2022)   ibuprofen (ADVIL) 200 MG tablet Take 600-800 mg by mouth every 6 (six) hours as needed for moderate pain or headache. (Patient not taking: Reported on 07/29/2022)   [DISCONTINUED] butalbital-acetaminophen-caffeine (FIORICET) 50-325-40 MG tablet Take 1-2 tablets by mouth every 6 (six) hours as needed for headache. (Patient not taking: Reported on 07/29/2022)   No facility-administered encounter medications on file as of 07/29/2022.    PAST MEDICAL HISTORY: Past Medical History:  Diagnosis Date   Abnormal Pap smear    Anxiety    Dysrhythmia    c/o palpitations (? anxiety)   Hearing loss     PAST SURGICAL HISTORY: Past Surgical History:  Procedure Laterality Date   CLEFT LIP REPAIR     LEEP      ALLERGIES: No Known Allergies  FAMILY HISTORY: Family History  Problem Relation Age of Onset   Hypertension Mother    Plantar fasciitis Mother     SOCIAL HISTORY: Social History   Tobacco Use   Smoking status: Never   Smokeless tobacco: Never  Substance Use Topics   Alcohol use: Yes    Comment: occasional   Drug use: No   Social History   Social History Narrative   Are you right handed or left handed? right   Are you currently employed ? no      Do you live at home alone? yes       What type of  home do you live in: 1 story or 2 story? One story   Caffeine Occasional     Objective:  Vital Signs:  BP (!) 127/90   Pulse 86   Ht 5\' 1"  (1.549 m)   Wt 178 lb (80.7 kg)   SpO2 98%   BMI 33.63 kg/m   General: No acute distress.  Patient appears well-groomed.   Head:  Normocephalic/atraumatic Eyes:  fundi examined. No evidence of papilledema. Neck: supple, Positive for paraspinal tenderness, reduced range of motion particularly when turning head to right  Neurological Exam: Mental status: alert and oriented, speech fluent and not dysarthric, language intact.  Cranial nerves: CN I: not tested CN II: pupils equal, round and reactive to light, visual fields intact CN III, IV, VI:  full range of motion, no nystagmus, no ptosis CN V: facial sensation intact. CN VII: upper and lower face symmetric CN VIII: hearing intact CN IX, X: gag intact, uvula midline CN XI: sternocleidomastoid and trapezius muscles intact CN XII: tongue midline  Bulk & Tone: normal Motor:  muscle strength 5/5 throughout Deep Tendon Reflexes:  2+ throughout Sensation:  sensation intact to light touch Finger to nose testing:  Without dysmetria.  Gait:  Normal station and stride.  Romberg negative.   Labs and Imaging review: Internal labs: No results found for: "HGBA1C" No results found for: "VITAMINB12" Lab Results  Component Value Date   TSH 5.622 (H) 05/10/2021   No results found for: "ESRSEDRATE", "POCTSEDRATE"   External labs: Normal or unremarkable: CBC, BMP, LFTs, folate, iron studies, TSH (3.087), HbA1c (5.1) B12: 338   Imaging: CT head wo contrast (05/10/21): FINDINGS: Brain: No evidence of acute infarction, hemorrhage, hydrocephalus, extra-axial collection or mass lesion/mass effect.   Vascular: There are vascular calcifications in the carotid siphons.   Skull: Normal. Negative for fracture or focal lesion.   Sinuses/Orbits: No acute finding.   Other: None.    IMPRESSION: No acute intracranial process.   Assessment/Plan:  Adrienne Wood is a 46 y.o. female who presents for evaluation of dizzy like sensations and headache. She has a relevant medical history of GERD, generalized anxiety, migraine, insomnia, tachycardia. Her neurological examination is essentially normal today. She does have neck tenderness to paraspinal and trapezius muscles and reduced range of motion. Patient's symptoms are likely multifactorial. She has sinus and ear pathology (previously ruptured ear drum) as well as neck pain and tightness. While she also has a history of migraine, her current symptoms are distinct and different from her typical migraine and do not respond to sumatriptan. In addition to any inner ear, sinus pathology, I suspect her cervicalgia may be playing a role in her recent dizzy like sensations and head pain.  PLAN: -Physical therapy for neck pain and tightness and dizziness -Meclizine as needed for dizziness -Continue sumatriptan as migraine abortive -Follow up with ENT as planned  -Return to clinic as needed  The impression above as well as the plan as outlined below were extensively discussed with the patient (in the company of mother) who voiced understanding. All questions were answered to their satisfaction.  When available, results of the above investigations and possible further recommendations will be communicated to the patient via telephone/MyChart. Patient to call office if not contacted after expected testing turnaround time.   Total time spent reviewing records, interview, history/exam, documentation, and coordination of care on day of encounter:  55 min   Thank you for allowing me to participate in patient's care.  If I can answer any additional questions, I would be pleased to do so.  Kai Levins, MD   CC: Hamrick, Lorin Mercy, MD [place] [id number]  CC: Referring provider: Leonides Sake, MD [address] [place],  [place] [address]

## 2022-07-29 ENCOUNTER — Encounter: Payer: Self-pay | Admitting: Neurology

## 2022-07-29 ENCOUNTER — Ambulatory Visit (INDEPENDENT_AMBULATORY_CARE_PROVIDER_SITE_OTHER): Payer: Self-pay | Admitting: Neurology

## 2022-07-29 VITALS — BP 127/90 | HR 86 | Ht 61.0 in | Wt 178.0 lb

## 2022-07-29 DIAGNOSIS — G43009 Migraine without aura, not intractable, without status migrainosus: Secondary | ICD-10-CM

## 2022-07-29 DIAGNOSIS — R42 Dizziness and giddiness: Secondary | ICD-10-CM

## 2022-07-29 DIAGNOSIS — M542 Cervicalgia: Secondary | ICD-10-CM

## 2022-07-29 DIAGNOSIS — R29898 Other symptoms and signs involving the musculoskeletal system: Secondary | ICD-10-CM

## 2022-07-29 MED ORDER — MECLIZINE HCL 25 MG PO TABS: 25.0000 mg | ORAL_TABLET | Freq: Two times a day (BID) | ORAL | 0 refills | Status: AC | PRN

## 2022-07-29 NOTE — Addendum Note (Signed)
Addended by: Renae Gloss on: 07/29/2022 03:21 PM   Modules accepted: Orders

## 2022-07-29 NOTE — Patient Instructions (Signed)
I saw you today for dizzy like feeling. I think your neck may be playing a part in your symptoms as it was tight with reduced range of motion.  I would like to send you to physical therapy for this.   I am sending a prescription for dizziness, called meclizine. You will take as needed.  Follow up with me as needed.  The physicians and staff at [facility] are committed to providing excellent care. You may receive a survey requesting feedback about your experience at our office. We strive to receive "very good" responses to the survey questions. If you feel that your experience would prevent you from giving the office a "very good " response, please contact our office to try to remedy the situation. We may be reached at [phone]. Thank you for taking the time out of your busy day to complete the survey.  Kai Levins, MD [facility]

## 2022-08-03 ENCOUNTER — Telehealth: Payer: Self-pay | Admitting: Neurology

## 2022-08-03 NOTE — Telephone Encounter (Signed)
Referral sent to [facility]

## 2022-08-03 NOTE — Telephone Encounter (Signed)
Pt called in stating she found out they do Physical Therapy through [facility] and they just need a referral to be sent to them.

## 2023-05-03 ENCOUNTER — Ambulatory Visit: Payer: Medicaid Other | Admitting: Obstetrics and Gynecology

## 2023-05-19 IMAGING — CT CT HEAD W/O CM
3 series · 16 of 46 positions shown, 19 images · non-contrast
Comparison: None.

CLINICAL DATA: Neurologic deficit, concern for stroke.

EXAM:
CT HEAD WITHOUT CONTRAST
TECHNIQUE: Contiguous axial images were obtained from the base of the skull
through the vertex without intravenous contrast.

[Series 2: head wo · axial · 0.38mm/px · z∈[-79,+41]mm · 10 of 29 slices shown, 13 images]
[im 3/29  brain]
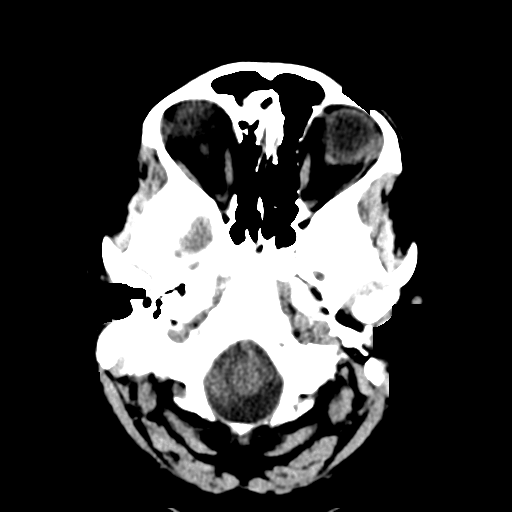
[im 3/29  bone]
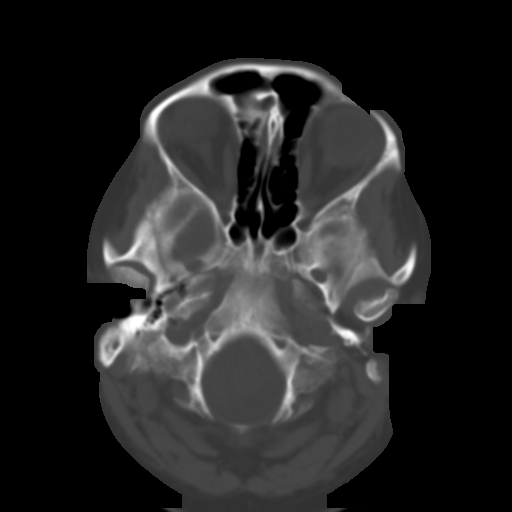
[im 6/29  brain]
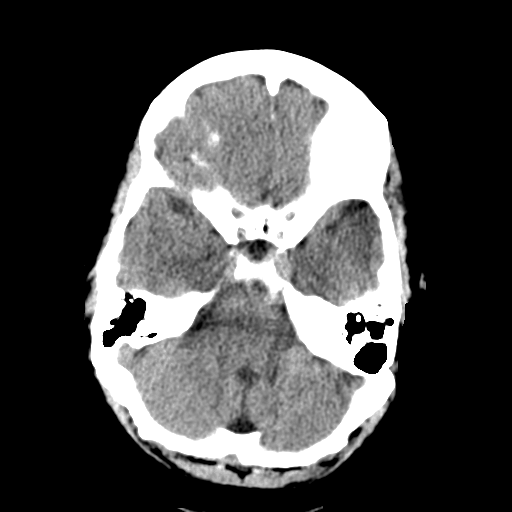
[im 8/29  brain]
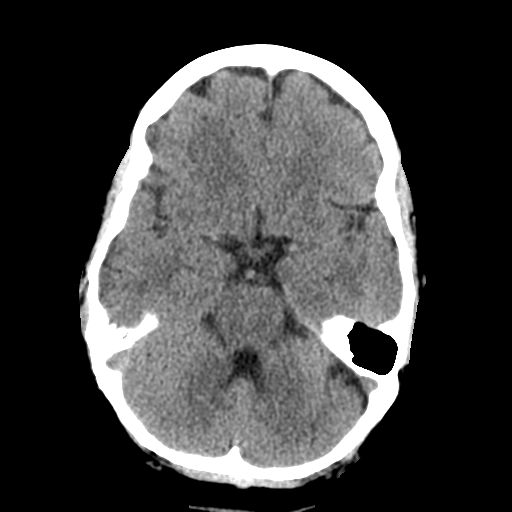
[im 11/29  brain]
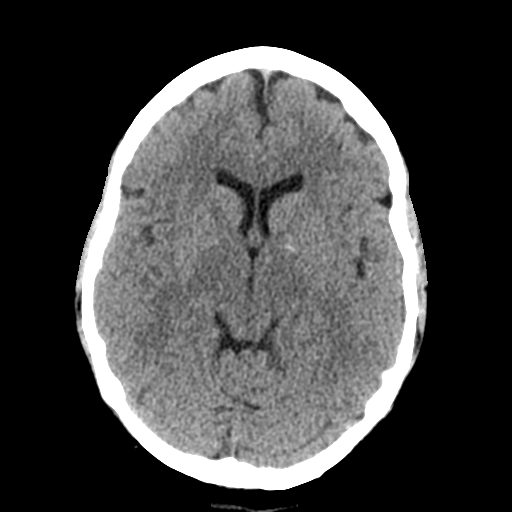
[im 14/29  brain]
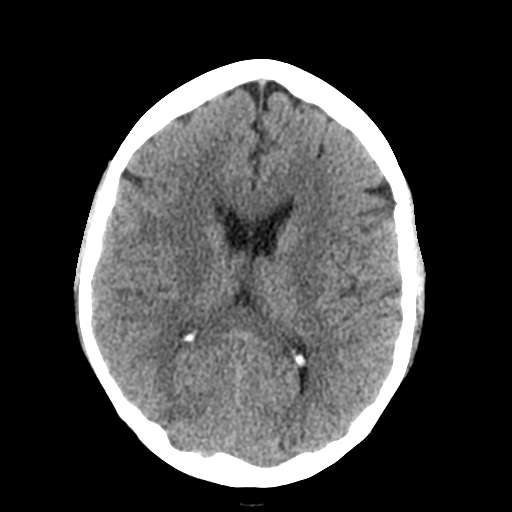
[im 14/29  bone]
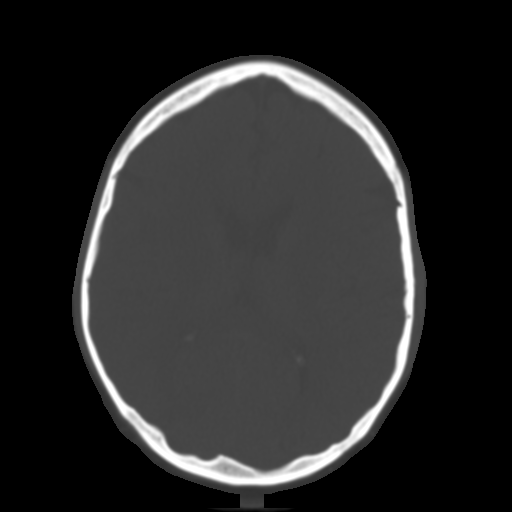
[im 16/29  brain]
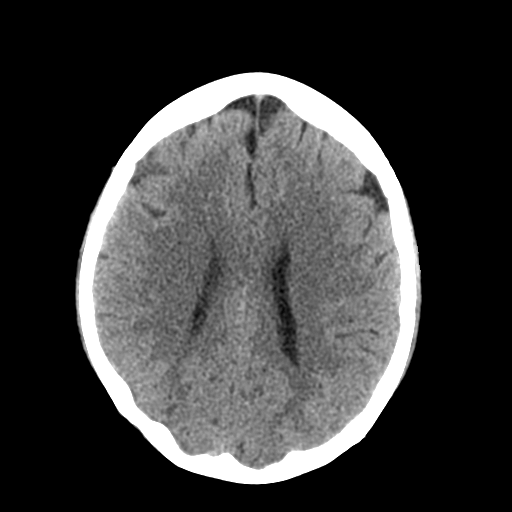
[im 19/29  brain]
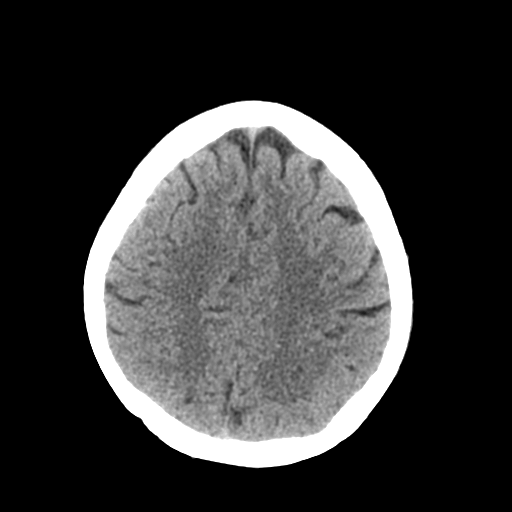
[im 22/29  brain]
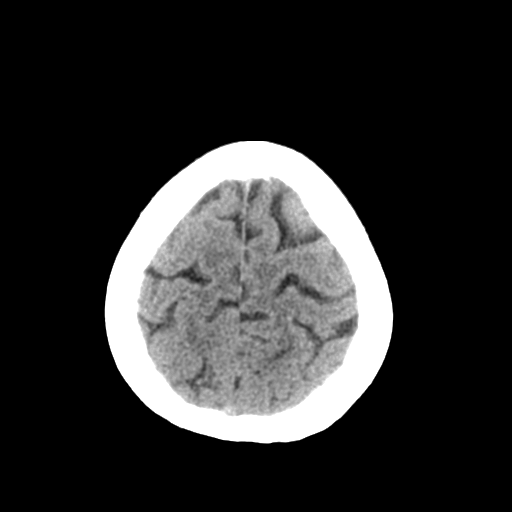
[im 24/29  brain]
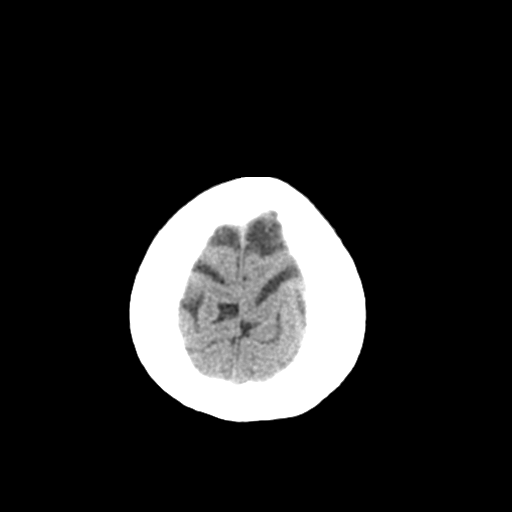
[im 24/29  bone]
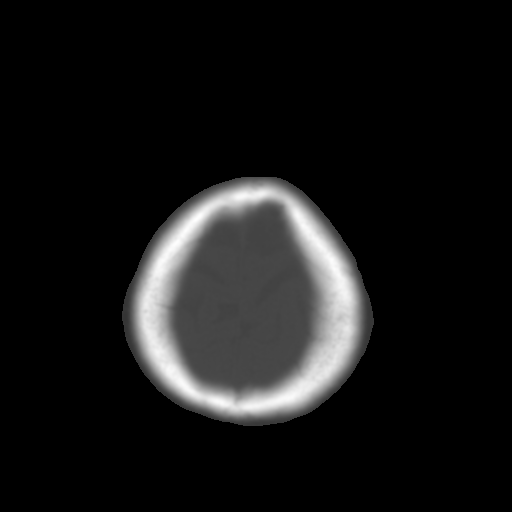
[im 27/29  brain]
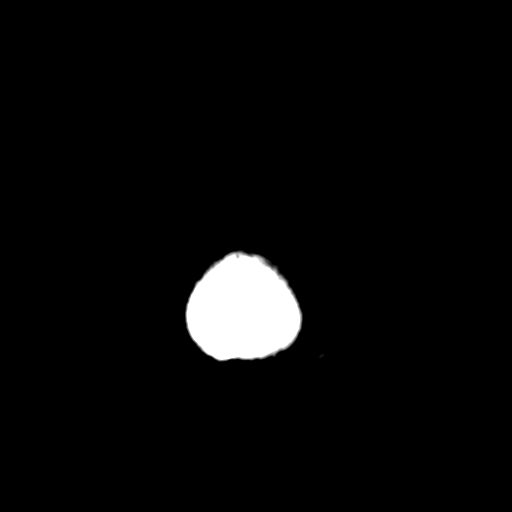

[Series 4: coronal soft tissue · coronal · 0.30mm/px · 3 of 63 slices shown]
[im 21/63  brain]
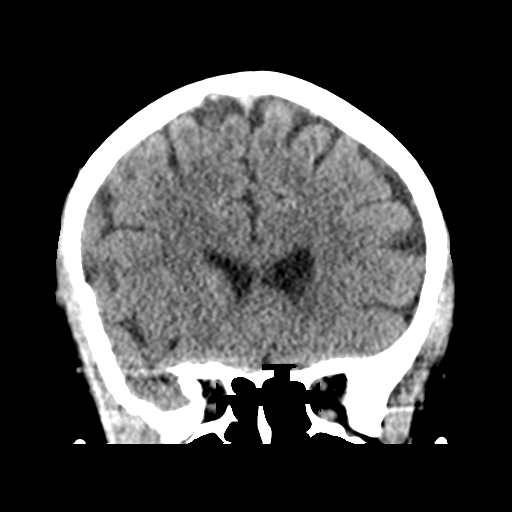
[im 28/63  brain]
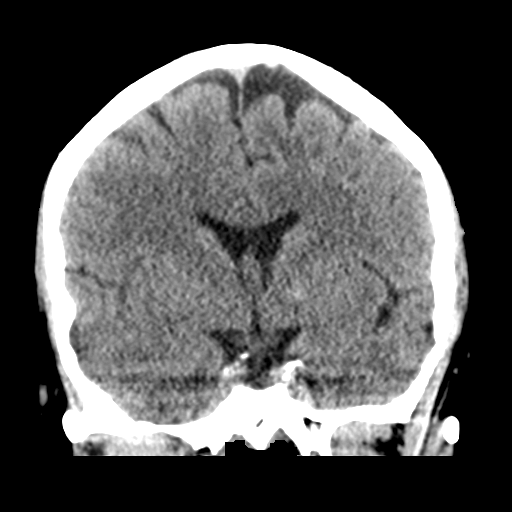
[im 35/63  brain]
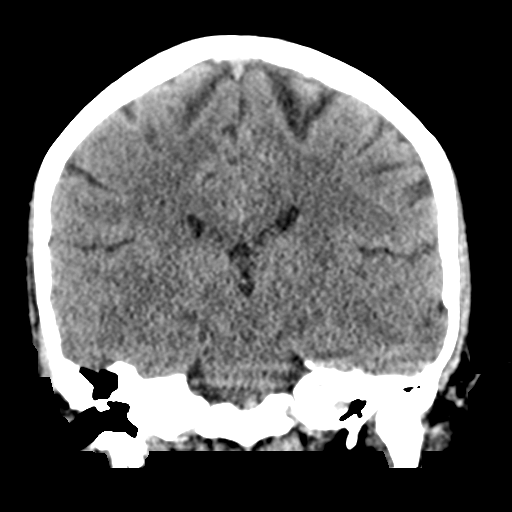

[Series 5: sagittal soft tissue · sagittal · 0.30mm/px · 3 of 55 slices shown]
[im 19/55  brain]
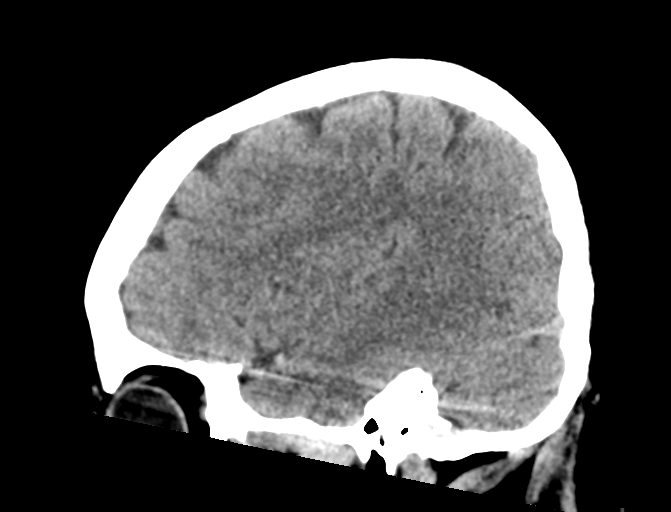
[im 28/55  brain]
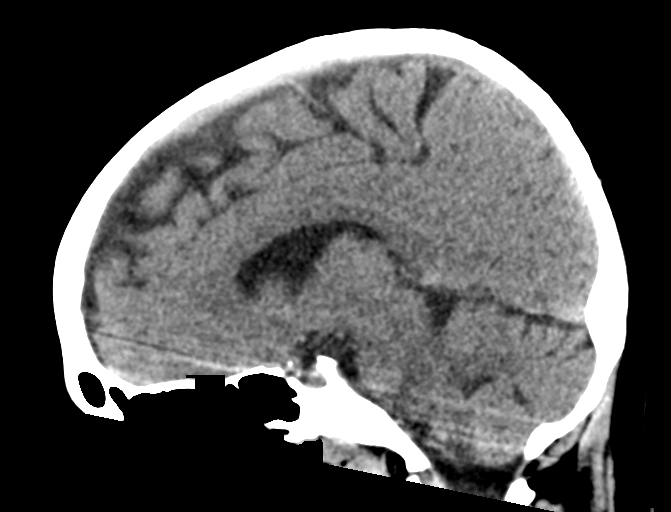
[im 37/55  brain]
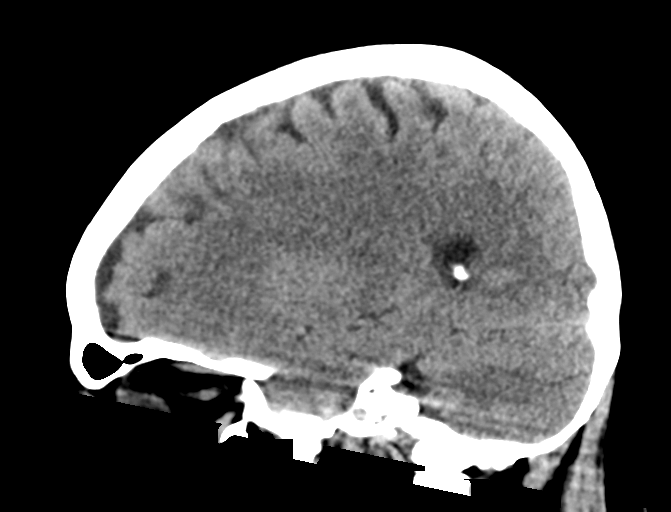

[16 of 46 positions shown; findings below may reference images not displayed]

FINDINGS: Brain: No evidence of acute infarction, hemorrhage, hydrocephalus,
extra-axial collection or mass lesion/mass effect.

Vascular: There are vascular calcifications in the carotid siphons.

Skull: Normal. Negative for fracture or focal lesion.

Sinuses/Orbits: No acute finding.

Other: None.
IMPRESSION: No acute intracranial process.

## 2023-06-16 ENCOUNTER — Ambulatory Visit (INDEPENDENT_AMBULATORY_CARE_PROVIDER_SITE_OTHER): Payer: Medicaid Other | Admitting: Obstetrics & Gynecology

## 2023-06-16 ENCOUNTER — Other Ambulatory Visit (HOSPITAL_COMMUNITY)
Admission: RE | Admit: 2023-06-16 | Discharge: 2023-06-16 | Disposition: A | Discharge status: Home / Self Care | Payer: Medicaid Other | Source: Ambulatory Visit | Attending: Obstetrics & Gynecology | Admitting: Obstetrics & Gynecology

## 2023-06-16 ENCOUNTER — Other Ambulatory Visit: Payer: Self-pay

## 2023-06-16 ENCOUNTER — Encounter: Payer: Self-pay | Admitting: Obstetrics & Gynecology

## 2023-06-16 VITALS — BP 142/89 | HR 78 | Ht 61.0 in | Wt 175.8 lb

## 2023-06-16 DIAGNOSIS — Z1231 Encounter for screening mammogram for malignant neoplasm of breast: Secondary | ICD-10-CM | POA: Diagnosis not present

## 2023-06-16 DIAGNOSIS — R1032 Left lower quadrant pain: Secondary | ICD-10-CM | POA: Diagnosis not present

## 2023-06-16 DIAGNOSIS — Z01419 Encounter for gynecological examination (general) (routine) without abnormal findings: Secondary | ICD-10-CM | POA: Insufficient documentation

## 2023-06-16 DIAGNOSIS — Z1211 Encounter for screening for malignant neoplasm of colon: Secondary | ICD-10-CM | POA: Diagnosis not present

## 2023-06-16 NOTE — Progress Notes (Signed)
GYNECOLOGY ANNUAL PREVENTATIVE CARE ENCOUNTER NOTE  History:     Adrienne Wood is a 47 y.o. [id number] female here for a routine annual gynecologic exam.  Current complaints: persistent LLQ pain for years.  Has been told it is likely musculoskeletal but she is worried about possible cyst and has not had a pelvic ultrasound for years.  Pain is intermittent in nature, not associated with any factors.   Denies  current abnormal vaginal bleeding, discharge, pelvic pain, problems with intercourse or other gynecologic concerns.    Gynecologic History Patient's last menstrual period was 05/28/2023 (exact date). Contraception: none Last Pap: 12/05/2018. Result was normal  Last Mammogram: Never had one Last Colonoscopy: Never had one  Obstetric History OB History  Gravida Para Term Preterm AB Living  1 0 0 0 1 0  SAB IAB Ectopic Multiple Live Births  0 1          # Outcome Date GA Lbr Len/2nd Weight Sex Type Anes PTL Lv  1 IAB 2004            Past Medical History:  Diagnosis Date   Abnormal Pap smear    Anxiety    Dysrhythmia    c/o palpitations (? anxiety)   Hearing loss     Past Surgical History:  Procedure Laterality Date   CLEFT LIP REPAIR     LEEP      Current Outpatient Medications on File Prior to Visit  Medication Sig Dispense Refill   omeprazole (PRILOSEC) 40 MG capsule Take 1 capsule by mouth daily as needed (heartburn).     rizatriptan (MAXALT) 5 MG tablet 5 mg.     sertraline (ZOLOFT) 25 MG tablet Take 50 mg by mouth at bedtime.     zolpidem (AMBIEN) 10 MG tablet Take 10 mg by mouth at bedtime as needed for sleep. 1/2 tab at nightly     No current facility-administered medications on file prior to visit.    No Known Allergies  Social History:  reports that she has never smoked. She has never used smokeless tobacco. She reports current alcohol use. She reports that she does not use drugs.  Family History  Problem Relation Age of Onset   Hypertension  Mother    Plantar fasciitis Mother     The following portions of the patient's history were reviewed and updated as appropriate: allergies, current medications, past family history, past medical history, past social history, past surgical history and problem list.  Review of Systems Pertinent items noted in HPI and remainder of comprehensive ROS otherwise negative.  Physical Exam:  BP (!) 142/89   Pulse 78   Ht 5\' 1"  (1.549 m)   Wt 175 lb 12.8 oz (79.7 kg)   LMP 05/28/2023 (Exact Date)   BMI 33.22 kg/m  CONSTITUTIONAL: Well-developed, well-nourished female in no acute distress.  HENT:  Normocephalic, atraumatic, External right and left ear normal.  EYES: Conjunctivae and EOM are normal. Pupils are equal, round, and reactive to light. No scleral icterus.  NECK: Normal range of motion, supple, no masses.  Normal thyroid.  SKIN: Skin is warm and dry. No rash noted. Not diaphoretic. No erythema. No pallor. MUSCULOSKELETAL: Normal range of motion. No tenderness.  No cyanosis, clubbing, or edema. NEUROLOGIC: Alert and oriented to person, place, and time. Normal reflexes, muscle tone coordination.  PSYCHIATRIC: Normal mood and affect. Normal behavior. Normal judgment and thought content. CARDIOVASCULAR: Normal heart rate noted, regular rhythm RESPIRATORY: Clear to auscultation bilaterally. Effort  and breath sounds normal, no problems with respiration noted. BREASTS: Symmetric in size. No masses, tenderness, skin changes, nipple drainage, or lymphadenopathy bilaterally. Performed in the presence of a chaperone. ABDOMEN: Soft, no distention noted.  No tenderness, rebound or guarding.  PELVIC: Normal appearing external genitalia and urethral meatus; normal appearing vaginal mucosa and cervix.  No abnormal vaginal discharge noted.  Pap smear obtained.  Normal uterine size, no other palpable masses, no uterine or adnexal tenderness.  Performed in the presence of a chaperone.   Assessment and Plan:     1. Left lower quadrant abdominal pain Advised that there are several possible etiologies as previously explained, however, will obtain ultrasound to evaluate for gynecologic etiologies.  - US PELVIC COMPLETE WITH TRANSVAGINAL; Future  2. Colon cancer screening Discussed need for colon cancer screening over the age 33. Colonoscopy recommended as this is the gold standard. Referral made to Gastroenterology for colonoscopy. - Ambulatory referral to Gastroenterology  3. Breast cancer screening by mammogram Normal breast examination today, she was advised to perform periodic self breast examinations.  Mammogram scheduled for breast cancer screening. - MM 3D SCREENING MAMMOGRAM BILATERAL BREAST; Future  4. Well woman exam with routine gynecological exam - Cytology - PAP( [place]) Will follow up results of pap smear and manage accordingly. Routine preventative health maintenance measures emphasized. Please refer to After Visit Summary for other counseling recommendations.      Jaynie Collins, MD, FACOG Obstetrician & Gynecologist, [facility] for [employer], [facility]

## 2023-06-21 ENCOUNTER — Ambulatory Visit (HOSPITAL_COMMUNITY)
Admission: RE | Admit: 2023-06-21 | Discharge: 2023-06-21 | Disposition: A | Discharge status: Home / Self Care | Payer: Medicaid Other | Source: Ambulatory Visit | Attending: Obstetrics & Gynecology | Admitting: Obstetrics & Gynecology

## 2023-06-21 DIAGNOSIS — R1032 Left lower quadrant pain: Secondary | ICD-10-CM | POA: Diagnosis present

## 2023-06-21 LAB — CYTOLOGY - PAP
Comment: NEGATIVE
Diagnosis: NEGATIVE
High risk HPV: NEGATIVE

## 2023-07-07 ENCOUNTER — Encounter: Payer: Self-pay | Admitting: Obstetrics & Gynecology
